# Patient Record
Sex: Female | Born: 1945 | Race: Black or African American | Hispanic: No | Marital: Married | Smoking: Former smoker
Health system: Southern US, Community
[De-identification: ages and names within clinical notes are randomized; demographics above are authoritative.]

## PROBLEM LIST (undated history)

## (undated) DIAGNOSIS — E039 Hypothyroidism, unspecified: Secondary | ICD-10-CM

## (undated) DIAGNOSIS — I82402 Acute embolism and thrombosis of unspecified deep veins of left lower extremity: Secondary | ICD-10-CM

## (undated) DIAGNOSIS — I10 Essential (primary) hypertension: Secondary | ICD-10-CM

## (undated) DIAGNOSIS — R591 Generalized enlarged lymph nodes: Principal | ICD-10-CM

## (undated) HISTORY — DX: Generalized enlarged lymph nodes: R59.1

## (undated) HISTORY — DX: Acute embolism and thrombosis of unspecified deep veins of left lower extremity: I82.402

---

## 1998-06-28 ENCOUNTER — Ambulatory Visit (HOSPITAL_COMMUNITY): Admission: RE | Admit: 1998-06-28 | Discharge: 1998-06-28 | Payer: Self-pay | Admitting: Internal Medicine

## 1998-06-28 ENCOUNTER — Encounter: Payer: Self-pay | Admitting: Internal Medicine

## 1998-07-10 ENCOUNTER — Ambulatory Visit (HOSPITAL_COMMUNITY): Admission: RE | Admit: 1998-07-10 | Discharge: 1998-07-10 | Payer: Self-pay | Admitting: Internal Medicine

## 1999-08-06 ENCOUNTER — Ambulatory Visit (HOSPITAL_COMMUNITY): Admission: RE | Admit: 1999-08-06 | Discharge: 1999-08-06 | Payer: Self-pay | Admitting: Internal Medicine

## 1999-08-06 ENCOUNTER — Encounter: Payer: Self-pay | Admitting: Internal Medicine

## 2000-12-01 ENCOUNTER — Ambulatory Visit (HOSPITAL_COMMUNITY): Admission: RE | Admit: 2000-12-01 | Discharge: 2000-12-01 | Payer: Self-pay | Admitting: Internal Medicine

## 2000-12-01 ENCOUNTER — Encounter: Payer: Self-pay | Admitting: Internal Medicine

## 2001-08-06 ENCOUNTER — Other Ambulatory Visit: Admission: RE | Admit: 2001-08-06 | Discharge: 2001-08-06 | Payer: Self-pay | Admitting: Internal Medicine

## 2002-01-08 ENCOUNTER — Encounter: Payer: Self-pay | Admitting: Internal Medicine

## 2002-01-08 ENCOUNTER — Ambulatory Visit (HOSPITAL_COMMUNITY): Admission: RE | Admit: 2002-01-08 | Discharge: 2002-01-08 | Payer: Self-pay | Admitting: Internal Medicine

## 2002-02-09 ENCOUNTER — Ambulatory Visit (HOSPITAL_COMMUNITY): Admission: RE | Admit: 2002-02-09 | Discharge: 2002-02-09 | Payer: Self-pay | Admitting: Gastroenterology

## 2002-02-09 ENCOUNTER — Encounter (INDEPENDENT_AMBULATORY_CARE_PROVIDER_SITE_OTHER): Payer: Self-pay | Admitting: Specialist

## 2002-08-10 ENCOUNTER — Other Ambulatory Visit: Admission: RE | Admit: 2002-08-10 | Discharge: 2002-08-10 | Payer: Self-pay | Admitting: Internal Medicine

## 2002-09-14 ENCOUNTER — Encounter: Payer: Self-pay | Admitting: Internal Medicine

## 2002-10-11 ENCOUNTER — Encounter: Payer: Self-pay | Admitting: Internal Medicine

## 2002-10-11 ENCOUNTER — Encounter (HOSPITAL_COMMUNITY): Admission: RE | Admit: 2002-10-11 | Discharge: 2003-01-09 | Payer: Self-pay | Admitting: Internal Medicine

## 2003-01-24 ENCOUNTER — Encounter: Payer: Self-pay | Admitting: Internal Medicine

## 2003-01-24 ENCOUNTER — Ambulatory Visit (HOSPITAL_COMMUNITY): Admission: RE | Admit: 2003-01-24 | Discharge: 2003-01-24 | Payer: Self-pay | Admitting: Internal Medicine

## 2004-07-31 ENCOUNTER — Ambulatory Visit (HOSPITAL_COMMUNITY): Admission: RE | Admit: 2004-07-31 | Discharge: 2004-07-31 | Payer: Self-pay | Admitting: Internal Medicine

## 2004-08-28 ENCOUNTER — Other Ambulatory Visit: Admission: RE | Admit: 2004-08-28 | Discharge: 2004-08-28 | Payer: Self-pay | Admitting: Internal Medicine

## 2005-09-25 ENCOUNTER — Other Ambulatory Visit: Admission: RE | Admit: 2005-09-25 | Discharge: 2005-09-25 | Payer: Self-pay | Admitting: Internal Medicine

## 2005-10-03 ENCOUNTER — Ambulatory Visit (HOSPITAL_COMMUNITY): Admission: RE | Admit: 2005-10-03 | Discharge: 2005-10-03 | Payer: Self-pay | Admitting: Internal Medicine

## 2005-10-04 ENCOUNTER — Ambulatory Visit (HOSPITAL_COMMUNITY): Admission: RE | Admit: 2005-10-04 | Discharge: 2005-10-04 | Payer: Self-pay | Admitting: Internal Medicine

## 2006-02-26 ENCOUNTER — Encounter: Payer: Self-pay | Admitting: Internal Medicine

## 2006-12-16 ENCOUNTER — Ambulatory Visit (HOSPITAL_COMMUNITY): Admission: RE | Admit: 2006-12-16 | Discharge: 2006-12-16 | Payer: Self-pay | Admitting: Internal Medicine

## 2006-12-24 ENCOUNTER — Other Ambulatory Visit: Admission: RE | Admit: 2006-12-24 | Discharge: 2006-12-24 | Payer: Self-pay | Admitting: Internal Medicine

## 2007-12-02 ENCOUNTER — Other Ambulatory Visit: Admission: RE | Admit: 2007-12-02 | Discharge: 2007-12-02 | Payer: Self-pay | Admitting: Internal Medicine

## 2007-12-18 ENCOUNTER — Ambulatory Visit (HOSPITAL_COMMUNITY): Admission: RE | Admit: 2007-12-18 | Discharge: 2007-12-18 | Payer: Self-pay | Admitting: Internal Medicine

## 2007-12-22 ENCOUNTER — Ambulatory Visit (HOSPITAL_COMMUNITY): Admission: RE | Admit: 2007-12-22 | Discharge: 2007-12-22 | Payer: Self-pay | Admitting: Internal Medicine

## 2008-12-06 ENCOUNTER — Other Ambulatory Visit: Admission: RE | Admit: 2008-12-06 | Discharge: 2008-12-06 | Payer: Self-pay | Admitting: Internal Medicine

## 2008-12-19 ENCOUNTER — Ambulatory Visit (HOSPITAL_COMMUNITY): Admission: RE | Admit: 2008-12-19 | Discharge: 2008-12-19 | Payer: Self-pay | Admitting: Internal Medicine

## 2010-05-03 ENCOUNTER — Encounter: Payer: Self-pay | Admitting: Internal Medicine

## 2010-05-03 ENCOUNTER — Ambulatory Visit: Payer: Self-pay | Admitting: Vascular Surgery

## 2010-05-03 ENCOUNTER — Ambulatory Visit (HOSPITAL_COMMUNITY): Admission: RE | Admit: 2010-05-03 | Discharge: 2010-05-03 | Payer: Self-pay | Admitting: Internal Medicine

## 2010-06-04 ENCOUNTER — Ambulatory Visit (HOSPITAL_COMMUNITY): Admission: RE | Admit: 2010-06-04 | Discharge: 2010-06-04 | Payer: Self-pay | Admitting: Internal Medicine

## 2011-02-25 ENCOUNTER — Other Ambulatory Visit: Payer: Self-pay | Admitting: Internal Medicine

## 2011-02-25 DIAGNOSIS — Z1231 Encounter for screening mammogram for malignant neoplasm of breast: Secondary | ICD-10-CM

## 2011-03-04 ENCOUNTER — Ambulatory Visit (HOSPITAL_COMMUNITY)
Admission: RE | Admit: 2011-03-04 | Discharge: 2011-03-04 | Disposition: A | Discharge status: Home / Self Care | Payer: BC Managed Care – PPO | Source: Ambulatory Visit | Attending: Internal Medicine | Admitting: Internal Medicine

## 2011-03-04 DIAGNOSIS — Z1231 Encounter for screening mammogram for malignant neoplasm of breast: Secondary | ICD-10-CM | POA: Insufficient documentation

## 2011-03-15 NOTE — Procedures (Signed)
[facility]  Patient:    Penny Stewart, Penny Stewart Visit Number: [id number] MRN: [id number]          Service Type: END Location: ENDO Attending Physician:  Nelda Marseille Dictated by:   Petra Kuba, M.D. Proc. Date: 02/09/02 Admit Date:  02/09/2002   CC:         Lindell Spar. Chestine Spore, M.D.                           Procedure Report  PROCEDURE:  Colonoscopy with biopsy.  INDICATIONS FOR PROCEDURE:  Family history of colon cancer due for colonic screening.  Consent was signed after risks, benefits, methods, and options were thoroughly discussed in the office.  MEDICINES USED:  Demerol 90, Versed 9.  DESCRIPTION OF PROCEDURE:  Rectal inspection was pertinent for external hemorrhoids, small. Digital exam was negative. The pediatric video adjustable colonoscope was inserted and easily advanced to the level of the ileocecal valve. On insertion in the left and right, diverticula were seen. On the left side of the colon, a small erosion was seen without particular worrisome appearance. Photo documentation was obtained. Unfortunately, this erosion was not able to be found on the way back to evaluate it and then biopsy it. To advance to the cecum required rolling her on her back and some abdominal pressure. The cecum was identified by the appendiceal orifice and the ileocecal valve. The prep was adequate. The scope was slowly withdrawn. Other than the right and left sided diverticula, on slow withdrawal through the right side of the colon, no abnormalities were seen. The transverse was normal until the proximal level of the splenic flexure where a small probable lipoma was seen down to polyp and a few cold biopsies were obtained. The scope was further withdrawn around the left side of the colon. Mid sigmoid the distal sigmoid hyperplastic appearing poly was seen and was cold biopsied x1 and put in the same container with the other questionable polyp. The scope  was withdrawn back to the rectum. We did not see the erosion as mentioned above. Once back in the rectum, the scope was retroflexed pertinent for some internal hemorrhoids. The scope was straightened and readvanced around the left side of the colon. On insertion, we could not find the erosion and then on slow withdrawal one more time again we could not find it. We elected to stop the procedure at this juncture, air was suctioned, scope removed. The patient tolerated the procedure adequately. There was no obvious or immediate complications.  ENDOSCOPIC DIAGNOSIS:  1. Internal and external small hemorrhoids.  2. Left and right diverticula.  3. Two questionable polyps one in the splenic flexure questionable lipoma,     one tiny in the sigmoid probably hyperplastic cold biopsied.  4. Small erosions seen on insertion on the left side unable to find     on withdrawal probably prep or medicine induced like aspirin or     nonsteroidal.  PLAN:  Await path but probably recheck colon screening in five years. Happy to see back p.r.n. otherwise return care to Dr. Chestine Spore for the customary health care maintenance to include yearly rectals and guaiacs. Dictated by:   Petra Kuba, M.D. Attending Physician:  Nelda Marseille DD:  02/09/02 TD:  02/09/02 Job: [id number]

## 2012-03-20 ENCOUNTER — Other Ambulatory Visit: Payer: Self-pay | Admitting: Internal Medicine

## 2012-03-20 DIAGNOSIS — Z1231 Encounter for screening mammogram for malignant neoplasm of breast: Secondary | ICD-10-CM

## 2012-04-13 ENCOUNTER — Ambulatory Visit (HOSPITAL_COMMUNITY)
Admission: RE | Admit: 2012-04-13 | Discharge: 2012-04-13 | Disposition: A | Discharge status: Home / Self Care | Payer: Medicare Other | Source: Ambulatory Visit | Attending: Internal Medicine | Admitting: Internal Medicine

## 2012-04-13 DIAGNOSIS — Z1231 Encounter for screening mammogram for malignant neoplasm of breast: Secondary | ICD-10-CM | POA: Insufficient documentation

## 2012-06-09 ENCOUNTER — Other Ambulatory Visit (HOSPITAL_COMMUNITY)
Admission: RE | Admit: 2012-06-09 | Discharge: 2012-06-09 | Disposition: A | Discharge status: Home / Self Care | Payer: Medicare Other | Source: Ambulatory Visit | Attending: Internal Medicine | Admitting: Internal Medicine

## 2012-06-09 DIAGNOSIS — Z124 Encounter for screening for malignant neoplasm of cervix: Secondary | ICD-10-CM | POA: Insufficient documentation

## 2012-08-03 ENCOUNTER — Other Ambulatory Visit: Payer: Self-pay | Admitting: Gastroenterology

## 2013-04-20 ENCOUNTER — Other Ambulatory Visit: Payer: Self-pay | Admitting: Internal Medicine

## 2013-04-20 DIAGNOSIS — Z1231 Encounter for screening mammogram for malignant neoplasm of breast: Secondary | ICD-10-CM

## 2013-05-03 ENCOUNTER — Ambulatory Visit (HOSPITAL_COMMUNITY)
Admission: RE | Admit: 2013-05-03 | Discharge: 2013-05-03 | Disposition: A | Discharge status: Home / Self Care | Payer: Medicare Other | Source: Ambulatory Visit | Attending: Internal Medicine | Admitting: Internal Medicine

## 2013-05-03 DIAGNOSIS — Z1231 Encounter for screening mammogram for malignant neoplasm of breast: Secondary | ICD-10-CM | POA: Insufficient documentation

## 2014-05-04 ENCOUNTER — Other Ambulatory Visit: Payer: Self-pay | Admitting: Internal Medicine

## 2014-05-04 DIAGNOSIS — Z1231 Encounter for screening mammogram for malignant neoplasm of breast: Secondary | ICD-10-CM

## 2014-05-10 ENCOUNTER — Ambulatory Visit (HOSPITAL_COMMUNITY)
Admission: RE | Admit: 2014-05-10 | Discharge: 2014-05-10 | Disposition: A | Discharge status: Home / Self Care | Payer: Medicare Other | Source: Ambulatory Visit | Attending: Internal Medicine | Admitting: Internal Medicine

## 2014-05-10 DIAGNOSIS — Z1231 Encounter for screening mammogram for malignant neoplasm of breast: Secondary | ICD-10-CM | POA: Insufficient documentation

## 2014-11-16 DIAGNOSIS — R51 Headache: Secondary | ICD-10-CM | POA: Diagnosis not present

## 2014-11-16 DIAGNOSIS — R634 Abnormal weight loss: Secondary | ICD-10-CM | POA: Diagnosis not present

## 2014-11-16 DIAGNOSIS — E780 Pure hypercholesterolemia: Secondary | ICD-10-CM | POA: Diagnosis not present

## 2014-11-16 DIAGNOSIS — I10 Essential (primary) hypertension: Secondary | ICD-10-CM | POA: Diagnosis not present

## 2014-11-16 DIAGNOSIS — Z23 Encounter for immunization: Secondary | ICD-10-CM | POA: Diagnosis not present

## 2014-11-16 DIAGNOSIS — E039 Hypothyroidism, unspecified: Secondary | ICD-10-CM | POA: Diagnosis not present

## 2015-06-26 ENCOUNTER — Other Ambulatory Visit: Payer: Self-pay | Admitting: Internal Medicine

## 2015-06-26 DIAGNOSIS — Z1231 Encounter for screening mammogram for malignant neoplasm of breast: Secondary | ICD-10-CM

## 2015-07-04 ENCOUNTER — Ambulatory Visit (HOSPITAL_COMMUNITY)
Admission: RE | Admit: 2015-07-04 | Discharge: 2015-07-04 | Disposition: A | Discharge status: Home / Self Care | Payer: Medicare Other | Source: Ambulatory Visit | Attending: Internal Medicine | Admitting: Internal Medicine

## 2015-07-04 DIAGNOSIS — Z1231 Encounter for screening mammogram for malignant neoplasm of breast: Secondary | ICD-10-CM | POA: Diagnosis not present

## 2015-07-11 DIAGNOSIS — E559 Vitamin D deficiency, unspecified: Secondary | ICD-10-CM | POA: Diagnosis not present

## 2015-07-11 DIAGNOSIS — E039 Hypothyroidism, unspecified: Secondary | ICD-10-CM | POA: Diagnosis not present

## 2015-07-11 DIAGNOSIS — E780 Pure hypercholesterolemia: Secondary | ICD-10-CM | POA: Diagnosis not present

## 2015-07-11 DIAGNOSIS — I10 Essential (primary) hypertension: Secondary | ICD-10-CM | POA: Diagnosis not present

## 2015-12-27 DIAGNOSIS — I10 Essential (primary) hypertension: Secondary | ICD-10-CM | POA: Diagnosis not present

## 2015-12-27 DIAGNOSIS — E7800 Pure hypercholesterolemia, unspecified: Secondary | ICD-10-CM | POA: Diagnosis not present

## 2015-12-27 DIAGNOSIS — E039 Hypothyroidism, unspecified: Secondary | ICD-10-CM | POA: Diagnosis not present

## 2015-12-27 DIAGNOSIS — E559 Vitamin D deficiency, unspecified: Secondary | ICD-10-CM | POA: Diagnosis not present

## 2016-04-11 DIAGNOSIS — E7800 Pure hypercholesterolemia, unspecified: Secondary | ICD-10-CM | POA: Diagnosis not present

## 2016-04-11 DIAGNOSIS — I10 Essential (primary) hypertension: Secondary | ICD-10-CM | POA: Diagnosis not present

## 2016-04-11 DIAGNOSIS — E039 Hypothyroidism, unspecified: Secondary | ICD-10-CM | POA: Diagnosis not present

## 2016-05-29 DIAGNOSIS — I10 Essential (primary) hypertension: Secondary | ICD-10-CM | POA: Diagnosis not present

## 2016-05-29 DIAGNOSIS — E7800 Pure hypercholesterolemia, unspecified: Secondary | ICD-10-CM | POA: Diagnosis not present

## 2016-05-29 DIAGNOSIS — E039 Hypothyroidism, unspecified: Secondary | ICD-10-CM | POA: Diagnosis not present

## 2016-07-15 ENCOUNTER — Other Ambulatory Visit: Payer: Self-pay | Admitting: Internal Medicine

## 2016-07-15 DIAGNOSIS — Z1231 Encounter for screening mammogram for malignant neoplasm of breast: Secondary | ICD-10-CM

## 2016-07-23 ENCOUNTER — Ambulatory Visit
Admission: RE | Admit: 2016-07-23 | Discharge: 2016-07-23 | Disposition: A | Discharge status: Home / Self Care | Payer: Medicare Other | Source: Ambulatory Visit | Attending: Internal Medicine | Admitting: Internal Medicine

## 2016-07-23 DIAGNOSIS — Z1231 Encounter for screening mammogram for malignant neoplasm of breast: Secondary | ICD-10-CM | POA: Diagnosis not present

## 2016-10-01 DIAGNOSIS — E039 Hypothyroidism, unspecified: Secondary | ICD-10-CM | POA: Diagnosis not present

## 2016-10-01 DIAGNOSIS — E781 Pure hyperglyceridemia: Secondary | ICD-10-CM | POA: Diagnosis not present

## 2016-10-01 DIAGNOSIS — I10 Essential (primary) hypertension: Secondary | ICD-10-CM | POA: Diagnosis not present

## 2017-02-04 ENCOUNTER — Other Ambulatory Visit: Payer: Self-pay | Admitting: Internal Medicine

## 2017-02-04 DIAGNOSIS — R630 Anorexia: Secondary | ICD-10-CM

## 2017-02-04 DIAGNOSIS — R634 Abnormal weight loss: Secondary | ICD-10-CM

## 2017-02-04 DIAGNOSIS — R1909 Other intra-abdominal and pelvic swelling, mass and lump: Secondary | ICD-10-CM

## 2017-02-07 ENCOUNTER — Ambulatory Visit (HOSPITAL_COMMUNITY)
Admission: RE | Admit: 2017-02-07 | Discharge: 2017-02-07 | Disposition: A | Discharge status: Home / Self Care | Payer: Medicare Other | Source: Ambulatory Visit | Attending: Internal Medicine | Admitting: Internal Medicine

## 2017-02-07 DIAGNOSIS — I700 Atherosclerosis of aorta: Secondary | ICD-10-CM | POA: Insufficient documentation

## 2017-02-07 DIAGNOSIS — D7389 Other diseases of spleen: Secondary | ICD-10-CM | POA: Insufficient documentation

## 2017-02-07 DIAGNOSIS — R591 Generalized enlarged lymph nodes: Secondary | ICD-10-CM | POA: Insufficient documentation

## 2017-02-07 DIAGNOSIS — R1909 Other intra-abdominal and pelvic swelling, mass and lump: Secondary | ICD-10-CM

## 2017-02-07 DIAGNOSIS — R634 Abnormal weight loss: Secondary | ICD-10-CM | POA: Diagnosis present

## 2017-02-07 DIAGNOSIS — R630 Anorexia: Secondary | ICD-10-CM | POA: Diagnosis present

## 2017-02-07 DIAGNOSIS — R599 Enlarged lymph nodes, unspecified: Secondary | ICD-10-CM

## 2017-02-07 HISTORY — DX: Enlarged lymph nodes, unspecified: R59.9

## 2017-02-07 MED ORDER — IOPAMIDOL (ISOVUE-300) INJECTION 61%
INTRAVENOUS | Status: AC
  Filled 2017-02-07: qty 100

## 2017-02-07 MED ORDER — IOPAMIDOL (ISOVUE-300) INJECTION 61%
100.0000 mL | Freq: Once | INTRAVENOUS | Status: AC | PRN
  Administered 2017-02-07: 80 mL via INTRAVENOUS

## 2017-02-11 ENCOUNTER — Telehealth: Payer: Self-pay | Admitting: Oncology

## 2017-02-11 ENCOUNTER — Telehealth: Payer: Self-pay | Admitting: Nurse Practitioner

## 2017-02-11 NOTE — Telephone Encounter (Signed)
Error

## 2017-02-11 NOTE — Telephone Encounter (Signed)
Appt has been scheduled for the pt to see Lisa/Dr. Burr Medico on 4/19 at 230pm. Pt agreed to the appt date and time. Aware to arrive 30 minutes early to get checked in on time.

## 2017-02-13 ENCOUNTER — Ambulatory Visit (HOSPITAL_BASED_OUTPATIENT_CLINIC_OR_DEPARTMENT_OTHER): Payer: Medicare Other | Admitting: Nurse Practitioner

## 2017-02-13 ENCOUNTER — Ambulatory Visit (HOSPITAL_BASED_OUTPATIENT_CLINIC_OR_DEPARTMENT_OTHER): Payer: Medicare Other

## 2017-02-13 ENCOUNTER — Encounter: Payer: Self-pay | Admitting: Nurse Practitioner

## 2017-02-13 ENCOUNTER — Telehealth: Payer: Self-pay | Admitting: Nurse Practitioner

## 2017-02-13 ENCOUNTER — Ambulatory Visit: Payer: Medicare Other | Admitting: Nurse Practitioner

## 2017-02-13 DIAGNOSIS — R599 Enlarged lymph nodes, unspecified: Secondary | ICD-10-CM

## 2017-02-13 DIAGNOSIS — R630 Anorexia: Secondary | ICD-10-CM | POA: Diagnosis not present

## 2017-02-13 DIAGNOSIS — Z87891 Personal history of nicotine dependence: Secondary | ICD-10-CM

## 2017-02-13 DIAGNOSIS — R591 Generalized enlarged lymph nodes: Secondary | ICD-10-CM

## 2017-02-13 DIAGNOSIS — I10 Essential (primary) hypertension: Secondary | ICD-10-CM | POA: Diagnosis not present

## 2017-02-13 DIAGNOSIS — E039 Hypothyroidism, unspecified: Secondary | ICD-10-CM

## 2017-02-13 DIAGNOSIS — Z800 Family history of malignant neoplasm of digestive organs: Secondary | ICD-10-CM

## 2017-02-13 DIAGNOSIS — R634 Abnormal weight loss: Secondary | ICD-10-CM

## 2017-02-13 DIAGNOSIS — Z809 Family history of malignant neoplasm, unspecified: Secondary | ICD-10-CM

## 2017-02-13 LAB — CBC WITH DIFFERENTIAL/PLATELET
BASO%: 0.3 % (ref 0.0–2.0)
Basophils Absolute: 0 10*3/uL (ref 0.0–0.1)
EOS%: 1.3 % (ref 0.0–7.0)
Eosinophils Absolute: 0.2 10*3/uL (ref 0.0–0.5)
HCT: 38 % (ref 34.8–46.6)
HEMOGLOBIN: 12.3 g/dL (ref 11.6–15.9)
LYMPH%: 12.6 % — ABNORMAL LOW (ref 14.0–49.7)
MCH: 26.9 pg (ref 25.1–34.0)
MCHC: 32.4 g/dL (ref 31.5–36.0)
MCV: 83 fL (ref 79.5–101.0)
MONO#: 0.8 10*3/uL (ref 0.1–0.9)
MONO%: 6.3 % (ref 0.0–14.0)
NEUT%: 79.5 % — ABNORMAL HIGH (ref 38.4–76.8)
NEUTROS ABS: 10.6 10*3/uL — AB (ref 1.5–6.5)
Platelets: 217 10*3/uL (ref 145–400)
RBC: 4.58 10*6/uL (ref 3.70–5.45)
RDW: 12.8 % (ref 11.2–14.5)
WBC: 13.4 10*3/uL — AB (ref 3.9–10.3)
lymph#: 1.7 10*3/uL (ref 0.9–3.3)

## 2017-02-13 LAB — COMPREHENSIVE METABOLIC PANEL
ALBUMIN: 4 g/dL (ref 3.5–5.0)
ALK PHOS: 136 U/L (ref 40–150)
ALT: 10 U/L (ref 0–55)
AST: 20 U/L (ref 5–34)
Anion Gap: 12 mEq/L — ABNORMAL HIGH (ref 3–11)
BUN: 15 mg/dL (ref 7.0–26.0)
CO2: 28 mEq/L (ref 22–29)
Calcium: 9.8 mg/dL (ref 8.4–10.4)
Chloride: 102 mEq/L (ref 98–109)
Creatinine: 0.8 mg/dL (ref 0.6–1.1)
EGFR: >90 mL/min/{1.73_m2} (ref >90)
GLUCOSE: 109 mg/dL (ref 70–140)
POTASSIUM: 4.2 meq/L (ref 3.5–5.1)
SODIUM: 142 meq/L (ref 136–145)
Total Bilirubin: 0.34 mg/dL (ref 0.20–1.20)
Total Protein: 7.6 g/dL (ref 6.4–8.3)

## 2017-02-13 LAB — LACTATE DEHYDROGENASE: LDH: 252 U/L — ABNORMAL HIGH (ref 125–245)

## 2017-02-13 NOTE — Addendum Note (Signed)
Addended by: Truitt Merle on: 02/13/2017 11:34 PM   Modules accepted: Orders

## 2017-02-13 NOTE — Telephone Encounter (Signed)
Gave patient AVS and calender per 4/19 los.

## 2017-02-13 NOTE — Progress Notes (Addendum)
[place]  Telephone:([phone]) [phone] Fax:([phone]) [place] Note   Patient Care Team: Foye Spurling, MD as PCP - General (Internal Medicine) 02/13/2017  CHIEF COMPLAINTS/PURPOSE OF CONSULTATION:  Lymphadenopathy  HISTORY OF PRESENTING ILLNESS:  Ms. Kozuch is a 71 year old woman referred for evaluation of lymphadenopathy. She reports weight loss over the past one to 2 years. Her husband reports more weight loss recently. For the past 2 months she has noted increasing fatigue and  in general not feeling well. 3-4 weeks ago she noted a "hard spot" in the left groin. She saw Dr. Carlis Abbott and was referred for CT scans of the abdomen and pelvis. She was found to have extensive retroperitoneal, left inguinal, right femoral and mesenteric lymphadenopathy. Multiple splenic masses were noted. She was referred to the [facility] for further evaluation.  MEDICAL HISTORY:  1. Hypothyroidism 2. Hypertension 3. Hypercholesterolemia  SURGICAL HISTORY: None  SOCIAL HISTORY: She lives in [place]. She is married. She has one son in good health. She is retired from [employer]. She quit smoking 2 years ago, less than 1 pack per day for greater than 25 years. Every "2 or so days" she either has a glass of wine or a shot of cognac.  FAMILY HISTORY: Mother deceased age 43 with colon cancer. Father deceased age 65 with heart problems. 10 siblings, all deceased. One sister died with "bile duct cancer". She thinks another sister may have had cancer as well.  ALLERGIES:  No known drug allergies  MEDICATIONS:  Current Outpatient Prescriptions  Medication Sig Dispense Refill  . amLODipine (NORVASC) 10 MG tablet Take 10 mg by mouth daily.    [name] atorvastatin (LIPITOR) 20 MG tablet Take 20 mg by mouth daily.    . cyanocobalamin 500 MCG tablet Take 500 mcg by mouth daily.    . mirtazapine (REMERON) 15 MG tablet Take 15 mg by mouth at bedtime.     No current  facility-administered medications for this visit.     REVIEW OF SYSTEMS:   Constitutional: Denies fevers, chills or abnormal night sweats. Decreased appetite. Poor energy level. Eyes: No vision change. No diplopia. Ears, nose, mouth, throat, and face: Denies mucositis or sore throat Respiratory: Denies cough, dyspnea or wheezes Cardiovascular: Denies palpitation, chest discomfort or lower extremity swelling Gastrointestinal:  Occasional mild nausea with certain scents. No dysphagia. Intermittent mild constipation. No bloody or black stools. Skin: Denies abnormal skin rashes Lymphatics: "Hard spot" left groin. Neurological: Denies numbness, tingling in the extremities. GU: No hematuria or dysuria. Behavioral/Psych: Husband has noted recent memory issues.  All other systems were reviewed with the patient and are negative.  PHYSICAL EXAMINATION:  Vitals:   02/13/17 1427  BP: 134/72  Pulse: 96  Resp: 16  Temp: 97.7 F (36.5 C)   Filed Weights   02/13/17 1427  Weight: 102 lb 14.4 oz (46.7 kg)    GENERAL:alert, no distress and comfortable SKIN: Pale appearing. No rashes or significant lesions EYES: normal, conjunctiva are pink and non-injected, sclera clear OROPHARYNX:no exudate, no erythema and lips, buccal mucosa, and tongue normal  NECK: supple, thyroid normal size, non-tender, without nodularity LYMPH:  1/2-2 cm right axillary lymph node; 1.5 cm left axillary lymph node; 3 cm left inguinal lymph node; 2 cm right femoral lymph node LUNGS: clear to auscultation and percussion with normal breathing effort HEART: regular rate & rhythm and no murmurs and no lower extremity edema ABDOMEN:abdomen soft, non-tender and normal bowel sounds; no  organomegaly. Musculoskeletal:no cyanosis of digits and no clubbing  PSYCH: alert & oriented x 3 with fluent speech NEURO: no focal motor/sensory deficits BREAST: No mass palpated in either breast RECTAL: No rectal mass. Stool brown, Hemoccult  negative  LABORATORY DATA:  None   RADIOGRAPHIC STUDIES: I have personally reviewed the radiological images as listed and agreed with the findings in the report. Ct Abdomen Pelvis W Contrast  Result Date: 02/07/2017 CLINICAL DATA:  Left inguinal mass.  Weight loss. EXAM: CT ABDOMEN AND PELVIS WITH CONTRAST TECHNIQUE: Multidetector CT imaging of the abdomen and pelvis was performed using the standard protocol following bolus administration of intravenous contrast. CONTRAST:  60m ISOVUE-300 IOPAMIDOL (ISOVUE-300) INJECTION 61% COMPARISON:  None. FINDINGS: Lower chest: Small fat containing Bochdalek's hernia and mild scarring in the left lung base. No pleural effusion. Hepatobiliary: 8 mm hypodensity in the left hepatic lobe, too small to fully characterize. Unremarkable gallbladder. No biliary dilatation. Pancreas: Unremarkable. Spleen: Multiple hypoenhancing splenic masses measuring up to 3.1 cm in size. Adrenals/Urinary Tract: Unremarkable right adrenal gland. Slight left adrenal nodularity. Two subcentimeter low-density lesions in the left kidney, too small to fully characterize. 5.1 x 4.7 cm cyst in the right lower pole. No hydronephrosis. Decompressed bladder. Stomach/Bowel: The stomach is within normal limits. There is no evidence of bowel obstruction. Colonic diverticulosis is noted without evidence of diverticulitis. The appendix is unremarkable. Vascular/Lymphatic: Diffuse abdominal aortic atherosclerosis without aneurysm. There is diffuse retroperitoneal lymphadenopathy. Left para-aortic lymph nodes measure up to 1.7 cm in short axis. Aortocaval lymph nodes measure up to 1.5 cm. An enlarged left pelvic sidewall lymph node measures 1.9 cm. Left external iliac lymph nodes measure up to 1.6 cm. Left inguinal lymph nodes measure up to 2.1 cm. There is a 1.5 cm right femoral lymph node. 2 adjacent lymph nodes/soft tissue nodules in the pelvic mesentery adjacent to the sigmoid colon measure 1.2 x 1.1 cm  and 1.6 x 1.2 cm (series 2, images 52 and 55). The mesenteric and some of the retroperitoneal lymph nodes demonstrate heterogeneous hypoenhancement and may be partially necrotic. Some of the inguinal/ femoral lymph nodes appear more hyperenhancing. Reproductive: The uterus and ovaries are unremarkable. Prominent vascular structures are noted in the adnexal bilaterally, particularly on the left with a prominent left gonadal vein noted. Other: No intraperitoneal free fluid.  No abdominal wall hernia. Musculoskeletal: No suspicious osseous lesions identified. IMPRESSION: 1. Extensive retroperitoneal, left inguinal, right femoral, and mesenteric lymphadenopathy highly concerning for malignancy and which may reflect metastatic disease or lymphoma. No clear primary malignancy identified in the abdomen or pelvis. 2. Multiple splenic masses consistent with metastases. 3. Aortic atherosclerosis. These results will be called to the ordering clinician or representative by the Radiologist Assistant, and communication documented in the PACS or zVision Dashboard. Electronically Signed   By: ALogan BoresM.D.   On: 02/07/2017 16:22    ASSESSMENT & PLAN:  1. Adenopathy on examination and CT 2. Anorexia/weight loss 3. Fatigue/malaise 4. Hypothyroid-reports she takes Synthroid but this is not on her medication list 5. Hypertension-on Norvasc 6. Hypercholesterolemia-on Lipitor  Ms. SAgostinellipresents with a several month history of weight loss and fatigue/malaise. She recently presented to her primary physician, Dr. CCarlis Abbott with palpable left inguinal adenopathy. CT scans of the abdomen/pelvis show extensive retroperitoneal, left inguinal, right femoral and mesenteric lymphadenopathy.  Dr. FBurr Medicoreviewed the above findings with Ms. SKlughand her family. They understand the lymphadenopathy may represent lymphoma versus another malignancy. We are referring her for an excisional  lymph node biopsy. We will obtain baseline labs  today to include a CBC, chemistry panel and LDH. We are referring her for a PET scan.  We scheduled a return visit in 2 weeks to review the results of the biopsy. She will contact the office in the interim with any problems.     Orders Placed This Encounter  Procedures  . NM PET Image Initial (PI) Skull Base To Thigh    Standing Status:   Future    Standing Expiration Date:   02/13/2018    Order Specific Question:   Reason for Exam (SYMPTOM  OR DIAGNOSIS REQUIRED)    Answer:   abdominal and pelvic adenopathy on CT    Order Specific Question:   If indicated for the ordered procedure, I authorize the administration of a radiopharmaceutical per Radiology protocol    Answer:   Yes    Order Specific Question:   Preferred imaging location?    Answer:   [facility]    Order Specific Question:   Radiology Contrast Protocol - do NOT remove file path    Answer:   \\[email or web address]  . CBC with Differential    Standing Status:   Future    Standing Expiration Date:   02/13/2018  . Comprehensive metabolic panel    Standing Status:   Future    Standing Expiration Date:   02/13/2018  . Lactate dehydrogenase (LDH)    Standing Status:   Future    Standing Expiration Date:   02/13/2018     Patient seen with Dr. Burr Medico.      Ned Card, NP 02/13/2017 3:48 PM  Addendum I have seen the patient, examined her. I agree with the assessment and and plan and have edited the notes.   71 year old female with past medical history of hypothyroidism, hypertension, presented with 2 month history of fatigue, anorexia, weight loss and diffuse adenopathy. This is highly suspicious for malignancy, especially high-grade lymphoma. Metastatic cancer is also a possibility, although no primary site was identified on the CT abdomen and pelvis, or on physical exam (head and neck, breast, anal/rectal primary etc). We'll obtain lab CBC, CMP, LDH today, and order a PET scan for further  evaluation. I'll refer her to general surgeon for excisional lymph node biopsy, hopefully to be done within the next week. I'll see her back after the scan and biopsy.  Truitt Merle  02/13/2017

## 2017-02-14 ENCOUNTER — Other Ambulatory Visit: Payer: Self-pay | Admitting: General Surgery

## 2017-02-18 ENCOUNTER — Encounter (HOSPITAL_COMMUNITY): Payer: Self-pay

## 2017-02-18 ENCOUNTER — Encounter (HOSPITAL_COMMUNITY)
Admission: RE | Admit: 2017-02-18 | Discharge: 2017-02-18 | Disposition: A | Discharge status: Home / Self Care | Payer: Medicare Other | Source: Ambulatory Visit | Attending: General Surgery | Admitting: General Surgery

## 2017-02-18 DIAGNOSIS — C773 Secondary and unspecified malignant neoplasm of axilla and upper limb lymph nodes: Secondary | ICD-10-CM | POA: Diagnosis not present

## 2017-02-18 DIAGNOSIS — R591 Generalized enlarged lymph nodes: Secondary | ICD-10-CM | POA: Diagnosis present

## 2017-02-18 DIAGNOSIS — Z87891 Personal history of nicotine dependence: Secondary | ICD-10-CM | POA: Diagnosis not present

## 2017-02-18 DIAGNOSIS — C801 Malignant (primary) neoplasm, unspecified: Secondary | ICD-10-CM | POA: Diagnosis not present

## 2017-02-18 DIAGNOSIS — Z79899 Other long term (current) drug therapy: Secondary | ICD-10-CM | POA: Diagnosis not present

## 2017-02-18 DIAGNOSIS — Z800 Family history of malignant neoplasm of digestive organs: Secondary | ICD-10-CM | POA: Diagnosis not present

## 2017-02-18 DIAGNOSIS — E039 Hypothyroidism, unspecified: Secondary | ICD-10-CM | POA: Diagnosis not present

## 2017-02-18 DIAGNOSIS — R630 Anorexia: Secondary | ICD-10-CM | POA: Diagnosis not present

## 2017-02-18 DIAGNOSIS — R634 Abnormal weight loss: Secondary | ICD-10-CM | POA: Diagnosis not present

## 2017-02-18 DIAGNOSIS — I10 Essential (primary) hypertension: Secondary | ICD-10-CM | POA: Diagnosis not present

## 2017-02-18 DIAGNOSIS — E785 Hyperlipidemia, unspecified: Secondary | ICD-10-CM | POA: Diagnosis not present

## 2017-02-18 HISTORY — DX: Hypothyroidism, unspecified: E03.9

## 2017-02-18 HISTORY — DX: Essential (primary) hypertension: I10

## 2017-02-18 LAB — CBC WITH DIFFERENTIAL/PLATELET
Basophils Absolute: 0.1 10*3/uL (ref 0.0–0.1)
Basophils Relative: 0 %
EOS PCT: 1 %
Eosinophils Absolute: 0.2 10*3/uL (ref 0.0–0.7)
HCT: 37.7 % (ref 36.0–46.0)
Hemoglobin: 12 g/dL (ref 12.0–15.0)
LYMPHS ABS: 1.4 10*3/uL (ref 0.7–4.0)
LYMPHS PCT: 11 %
MCH: 26.3 pg (ref 26.0–34.0)
MCHC: 31.8 g/dL (ref 30.0–36.0)
MCV: 82.5 fL (ref 78.0–100.0)
MONO ABS: 0.6 10*3/uL (ref 0.1–1.0)
MONOS PCT: 5 %
Neutro Abs: 10.6 10*3/uL — ABNORMAL HIGH (ref 1.7–7.7)
Neutrophils Relative %: 83 %
PLATELETS: 245 10*3/uL (ref 150–400)
RBC: 4.57 MIL/uL (ref 3.87–5.11)
RDW: 12.6 % (ref 11.5–15.5)
WBC: 12.9 10*3/uL — ABNORMAL HIGH (ref 4.0–10.5)

## 2017-02-18 LAB — COMPREHENSIVE METABOLIC PANEL
ALT: 11 U/L — ABNORMAL LOW (ref 14–54)
AST: 22 U/L (ref 15–41)
Albumin: 4.1 g/dL (ref 3.5–5.0)
Alkaline Phosphatase: 111 U/L (ref 38–126)
Anion gap: 10 (ref 5–15)
BUN: 18 mg/dL (ref 6–20)
CHLORIDE: 105 mmol/L (ref 101–111)
CO2: 25 mmol/L (ref 22–32)
Calcium: 9.5 mg/dL (ref 8.9–10.3)
Creatinine, Ser: 0.63 mg/dL (ref 0.44–1.00)
Glucose, Bld: 87 mg/dL (ref 65–99)
POTASSIUM: 3.9 mmol/L (ref 3.5–5.1)
Sodium: 140 mmol/L (ref 135–145)
TOTAL PROTEIN: 7.2 g/dL (ref 6.5–8.1)
Total Bilirubin: 0.4 mg/dL (ref 0.3–1.2)

## 2017-02-18 NOTE — Pre-Procedure Instructions (Signed)
    Penny Stewart  02/18/2017      [facility] [place] [address] [place] [address] Phone: [phone] Fax: [phone]    Your procedure is scheduled on April 26.  Report to [facility] Admitting at 1:30 P   Call this number if you have problems the morning of surgery:  [phone]   Remember:  Do not eat food or drink liquids after midnight.  Take these medicines the morning of surgery with A SIP OF WATER :amLODipine (NORVASC), levothyroxine (SYNTHROID, LEVOTHROID)   STOP aspirin, herbal medication, vitamins, advil, aleve, ibuprofen   Do not wear jewelry, make-up or nail polish.  Do not wear lotions, powders, or perfumes, or deoderant.  Do not shave 48 hours prior to surgery.  Men may shave face and neck.  Do not bring valuables to the hospital.  [facility] is not responsible for any belongings or valuables.  Contacts, dentures or bridgework may not be worn into surgery.  Leave your suitcase in the car.  After surgery it may be brought to your room.  For patients admitted to the hospital, discharge time will be determined by your treatment team.  Patients discharged the day of surgery will not be allowed to drive home.   Name and phone number of your driver:    Special instructions:  Preparing for surgery  Please read over the following fact sheets that you were given. Pain Booklet and Surgical Site Infection Prevention

## 2017-02-19 NOTE — H&P (Signed)
Penny Stewart Location: [facility] Patient #: [id number] DOB: 1946-08-22 Undefined / Language: [name] / Race: White Female        History of Present Illness      The patient is a 71 year old female who presents with a complaint of diffuse lymphadenopathy. This is a 71 year old woman who is here with her husband to discuss right axillary lymph node biopsy. She is referred by Dr. Burr Medico. Her PCP is Jeanann Lewandowsky.      She has noticed a lump in her left groin for 3 or 4 weeks. It's a little tender but no pain at rest. Denies trauma or inflammation or drainage. She reports a 20 pound weight loss over 2 years. Increasing fatigue and anorexia. Denies skin rash night sweats. A CT scan shows extensive retroperitoneal, left inguinal, right femoral, and mesenteric lymphadenopathy, concerning for malignancy. 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 Lab work shows hemoglobin 12.3, WBC 13,400, platelet count 217,000. Creatinine 0.8. LDH 252. Liver tests normal.      Past history reveals hypothyroidism, hypertension, hyperlipidemia. She has never had surgery.      Family history mother deceased age 21 with colon cancer. Father deceased age 54 with heart problems. All 10 silks siblings have deceased. One had bile duct cancer. He may have been another cancer. Doesn't know too much about the history there.      Social history she lives in [place] with her husband. One son in good health. She is referred retired from the [employer] and worked at [employer]. Quit smoking 2 years ago. Occasional glass of wine.       She is scheduled for right axillary deep lymph node excisional biopsy this coming Thursday, 2 days from now. She going over to [facility] today for preadmission testing. I discussed the indications, details, techniques, and numerous risk of the surgery with her and her husband. She is aware the risks of bleeding,  infection, hematoma, nerve damage with chronic pain or numbness under the arm, arm swelling, shoulder disability, and other unforeseen problems. She understands all these issues. All of her questions are answered. She agrees with this plan.       I told her that if this turned out to be lymphoma that she might need chemotherapy. I told her that I would standby to insert a Port-A-Cath which she is familiar with, should that be necessary.   Past Surgical History  Colon Polyp Removal - Colonoscopy   Allergies  No Known Drug Allergies 02/18/2017  Medication History AmLODIPine Besylate ('10MG'$  Tablet, Oral) Active. Atorvastatin Calcium ('20MG'$  Tablet, Oral) Active. Mirtazapine ('15MG'$  Tablet, Oral) Active. Medications Reconciled  Social History  Alcohol use  Occasional alcohol use. Caffeine use  Carbonated beverages, Coffee. No drug use  Tobacco use  Former smoker.  Family History  Family history unknown  First Degree Relatives   Other Problems  No pertinent past medical history     Review of Systems  General Present- Appetite Loss, Chills and Fatigue. Not Present- Fever, Night Sweats, Weight Gain and Weight Loss. Skin Not Present- Change in Wart/Mole, Dryness, Hives, Jaundice, New Lesions, Non-Healing Wounds, Rash and Ulcer. HEENT Not Present- Earache, Hearing Loss, Hoarseness, Nose Bleed, Oral Ulcers, Ringing in the Ears, Seasonal Allergies, Sinus Pain, Sore Throat, Visual Disturbances, Wears glasses/contact lenses and Yellow Eyes. Respiratory Not Present- Bloody sputum, Chronic Cough, Difficulty Breathing, Snoring and Wheezing. Breast Not Present- Breast Mass, Breast Pain, Nipple Discharge and Skin Changes.  Cardiovascular Not Present- Chest Pain, Difficulty Breathing Lying Down, Leg Cramps, Palpitations, Rapid Heart Rate, Shortness of Breath and Swelling of Extremities. Gastrointestinal Not Present- Abdominal Pain, Bloating, Bloody Stool, Change in Bowel Habits, Chronic  diarrhea, Constipation, Difficulty Swallowing, Excessive gas, Gets full quickly at meals, Hemorrhoids, Indigestion, Nausea, Rectal Pain and Vomiting.  Vitals  Weight: 102 lb Height: 55.5in Body Surface Area: 1.33 m Body Mass Index: 23.28 kg/m  Temp.: 98.64F  Pulse: 95 (Regular)  BP: 118/60 (Sitting, Left Arm, Standard)    Physical Exam  General Mental Status-Alert. General Appearance-Consistent with stated age. Hydration-Well hydrated. Voice-Normal. Note: BMI 23   Head and Neck Trachea-midline. Thyroid Gland Characteristics - normal size and consistency. Note: Supple. There appears to be bilateral supraclavicular lymph nodes about 1.5 cm in diameter. Skin healthy. No oral lesions. Trachea midline   Eye Eyeball - Bilateral-Extraocular movements intact. Sclera/Conjunctiva - Bilateral-No scleral icterus.  Chest and Lung Exam Chest and lung exam reveals -quiet, even and easy respiratory effort with no use of accessory muscles and on auscultation, normal breath sounds, no adventitious sounds and normal vocal resonance. Inspection Chest Wall - Normal. Back - normal. Note: Tubes centimeter mobile right axillary mass, deep. 1.5 cm left axillary mass, deep. Nontender. No arm swelling.   Cardiovascular Cardiovascular examination reveals -normal heart sounds, regular rate and rhythm with no murmurs and normal pedal pulses bilaterally.  Abdomen Inspection Inspection of the abdomen reveals - No Hernias. Skin - Scar - no surgical scars. Palpation/Percussion Palpation and Percussion of the abdomen reveal - Soft, Non Tender, No Rebound tenderness, No Rigidity (guarding) and No hepatosplenomegaly. Auscultation Auscultation of the abdomen reveals - Bowel sounds normal. Note: Abdomen is nontender but she guards voluntarily making it difficult to rule out organomegaly. No scars or hernias.   Neurologic Neurologic evaluation reveals -alert and  oriented x 3 with no impairment of recent or remote memory. Mental Status-Normal.  Musculoskeletal Normal Exam - Left-Upper Extremity Strength Normal and Lower Extremity Strength Normal. Normal Exam - Right-Upper Extremity Strength Normal and Lower Extremity Strength Normal.  Lymphatic Note: Bilateral supraclavicular lymph nodes, medial third of clavicle, mobile and nontender, 1.5 cm. 2 cm right axillary lymph node and 1.5 cm left axillary lymph node, mobile. 3 cm somewhat fixed left inguinal mass. A little bit tender overlying skin okay. Small right femoral lymph node     Assessment & Plan LYMPHADENOPATHY, GENERALIZED (R59.1)   Your CT scans show abnormally enlarged lymph nodes in your groins, abdomen, under arms and spleen. We can feel some of these lymph nodes on physical exam, as we discussed today There is a concern that she may have a lymphoma A small operation will be required to remove one of these lymph nodes so the proper tests can be done and decision can be made about her treatment  you'll be scheduled for right axillary deep lymph node excisional biopsy We have discussed the indications, techniques, and risk of this surgery in detail  SPLENIC MASS (R16.1) HYPERTENSION, BENIGN (I10) WEIGHT LOSS, ABNORMAL (R63.4) Impression: 20 pounds in 2 years. Unintentional. Associated with anorexia   Edsel Petrin. Dalbert Batman, M.D., [facility] General and Minimally invasive Surgery Breast and Colorectal Surgery Office:   [phone] Pager:   [phone]

## 2017-02-20 ENCOUNTER — Encounter (HOSPITAL_COMMUNITY)
Admission: RE | Disposition: A | Discharge status: Home / Self Care | Payer: Self-pay | Source: Ambulatory Visit | Attending: General Surgery

## 2017-02-20 ENCOUNTER — Ambulatory Visit (HOSPITAL_COMMUNITY): Payer: Medicare Other | Admitting: Anesthesiology

## 2017-02-20 ENCOUNTER — Ambulatory Visit (HOSPITAL_COMMUNITY): Payer: Medicare Other | Admitting: Vascular Surgery

## 2017-02-20 ENCOUNTER — Encounter (HOSPITAL_COMMUNITY): Payer: Self-pay | Admitting: Surgery

## 2017-02-20 ENCOUNTER — Ambulatory Visit (HOSPITAL_COMMUNITY)
Admission: RE | Admit: 2017-02-20 | Discharge: 2017-02-20 | Disposition: A | Discharge status: Home / Self Care | Payer: Medicare Other | Source: Ambulatory Visit | Attending: General Surgery | Admitting: General Surgery

## 2017-02-20 DIAGNOSIS — C773 Secondary and unspecified malignant neoplasm of axilla and upper limb lymph nodes: Secondary | ICD-10-CM | POA: Diagnosis not present

## 2017-02-20 DIAGNOSIS — E785 Hyperlipidemia, unspecified: Secondary | ICD-10-CM | POA: Diagnosis not present

## 2017-02-20 DIAGNOSIS — Z800 Family history of malignant neoplasm of digestive organs: Secondary | ICD-10-CM | POA: Insufficient documentation

## 2017-02-20 DIAGNOSIS — I10 Essential (primary) hypertension: Secondary | ICD-10-CM | POA: Insufficient documentation

## 2017-02-20 DIAGNOSIS — C801 Malignant (primary) neoplasm, unspecified: Secondary | ICD-10-CM | POA: Insufficient documentation

## 2017-02-20 DIAGNOSIS — Z79899 Other long term (current) drug therapy: Secondary | ICD-10-CM | POA: Insufficient documentation

## 2017-02-20 DIAGNOSIS — R591 Generalized enlarged lymph nodes: Secondary | ICD-10-CM | POA: Diagnosis present

## 2017-02-20 DIAGNOSIS — Z87891 Personal history of nicotine dependence: Secondary | ICD-10-CM | POA: Insufficient documentation

## 2017-02-20 DIAGNOSIS — R630 Anorexia: Secondary | ICD-10-CM | POA: Insufficient documentation

## 2017-02-20 DIAGNOSIS — E039 Hypothyroidism, unspecified: Secondary | ICD-10-CM | POA: Insufficient documentation

## 2017-02-20 DIAGNOSIS — R599 Enlarged lymph nodes, unspecified: Secondary | ICD-10-CM

## 2017-02-20 DIAGNOSIS — R634 Abnormal weight loss: Secondary | ICD-10-CM | POA: Insufficient documentation

## 2017-02-20 HISTORY — PX: MASS EXCISION: SHX2000

## 2017-02-20 SURGERY — EXCISION MASS
Anesthesia: General | Site: Axilla | Laterality: Right

## 2017-02-20 MED ORDER — 0.9 % SODIUM CHLORIDE (POUR BTL) OPTIME
TOPICAL | Status: DC | PRN
  Administered 2017-02-20: 1000 mL

## 2017-02-20 MED ORDER — EPHEDRINE SULFATE 50 MG/ML IJ SOLN
INTRAMUSCULAR | Status: DC | PRN
  Administered 2017-02-20: 10 mg via INTRAVENOUS

## 2017-02-20 MED ORDER — CHLORHEXIDINE GLUCONATE CLOTH 2 % EX PADS: 6.0000 | MEDICATED_PAD | Freq: Once | CUTANEOUS | Status: DC

## 2017-02-20 MED ORDER — SODIUM CHLORIDE 0.9 % IV SOLN: 250.0000 mL | INTRAVENOUS | Status: DC | PRN

## 2017-02-20 MED ORDER — HYDROCODONE-ACETAMINOPHEN 5-325 MG PO TABS: 1.0000 | ORAL_TABLET | Freq: Four times a day (QID) | ORAL | 0 refills | Status: DC | PRN

## 2017-02-20 MED ORDER — LIDOCAINE HCL (CARDIAC) 20 MG/ML IV SOLN
INTRAVENOUS | Status: DC | PRN
  Administered 2017-02-20: 100 mg via INTRAVENOUS

## 2017-02-20 MED ORDER — ONDANSETRON HCL 4 MG/2ML IJ SOLN
INTRAMUSCULAR | Status: DC | PRN
  Administered 2017-02-20: 4 mg via INTRAVENOUS

## 2017-02-20 MED ORDER — HYDROMORPHONE HCL 1 MG/ML IJ SOLN
0.2500 mg | INTRAMUSCULAR | Status: DC | PRN
  Administered 2017-02-20: 0.5 mg via INTRAVENOUS

## 2017-02-20 MED ORDER — ACETAMINOPHEN 325 MG PO TABS: 650.0000 mg | ORAL_TABLET | ORAL | Status: DC | PRN

## 2017-02-20 MED ORDER — HYDROMORPHONE HCL 1 MG/ML IJ SOLN
INTRAMUSCULAR | Status: AC
  Filled 2017-02-20: qty 0.5

## 2017-02-20 MED ORDER — PROPOFOL 10 MG/ML IV BOLUS
INTRAVENOUS | Status: AC
  Filled 2017-02-20: qty 20

## 2017-02-20 MED ORDER — EPHEDRINE 5 MG/ML INJ
INTRAVENOUS | Status: AC
  Filled 2017-02-20: qty 10

## 2017-02-20 MED ORDER — FENTANYL CITRATE (PF) 250 MCG/5ML IJ SOLN
INTRAMUSCULAR | Status: AC
  Filled 2017-02-20: qty 5

## 2017-02-20 MED ORDER — OXYCODONE HCL 5 MG PO TABS: 5.0000 mg | ORAL_TABLET | ORAL | Status: DC | PRN

## 2017-02-20 MED ORDER — LIDOCAINE-EPINEPHRINE (PF) 1 %-1:200000 IJ SOLN
INTRAMUSCULAR | Status: DC | PRN
  Administered 2017-02-20: 6 mL

## 2017-02-20 MED ORDER — LACTATED RINGERS IV SOLN
INTRAVENOUS | Status: DC
  Administered 2017-02-20: 14:00:00 via INTRAVENOUS

## 2017-02-20 MED ORDER — PHENYLEPHRINE HCL 10 MG/ML IJ SOLN
INTRAMUSCULAR | Status: AC
  Filled 2017-02-20: qty 1

## 2017-02-20 MED ORDER — CEFAZOLIN SODIUM-DEXTROSE 2-4 GM/100ML-% IV SOLN
2.0000 g | INTRAVENOUS | Status: AC
  Administered 2017-02-20: 2 g via INTRAVENOUS
  Filled 2017-02-20: qty 100

## 2017-02-20 MED ORDER — EPINEPHRINE PF 1 MG/10ML IJ SOSY
PREFILLED_SYRINGE | INTRAMUSCULAR | Status: AC
  Filled 2017-02-20: qty 10

## 2017-02-20 MED ORDER — MIDAZOLAM HCL 2 MG/2ML IJ SOLN
INTRAMUSCULAR | Status: AC
  Filled 2017-02-20: qty 2

## 2017-02-20 MED ORDER — ACETAMINOPHEN 10 MG/ML IV SOLN: 1000.0000 mg | Freq: Once | INTRAVENOUS | Status: DC | PRN

## 2017-02-20 MED ORDER — LABETALOL HCL 5 MG/ML IV SOLN
INTRAVENOUS | Status: AC
  Filled 2017-02-20: qty 4

## 2017-02-20 MED ORDER — LABETALOL HCL 5 MG/ML IV SOLN: 5.0000 mg | INTRAVENOUS | Status: DC | PRN

## 2017-02-20 MED ORDER — PROPOFOL 10 MG/ML IV BOLUS
INTRAVENOUS | Status: DC | PRN
  Administered 2017-02-20: 100 mg via INTRAVENOUS

## 2017-02-20 MED ORDER — FENTANYL CITRATE (PF) 100 MCG/2ML IJ SOLN
INTRAMUSCULAR | Status: DC | PRN
  Administered 2017-02-20: 50 ug via INTRAVENOUS
  Administered 2017-02-20: 25 ug via INTRAVENOUS

## 2017-02-20 MED ORDER — ACETAMINOPHEN 650 MG RE SUPP: 650.0000 mg | RECTAL | Status: DC | PRN

## 2017-02-20 MED ORDER — ONDANSETRON HCL 4 MG/2ML IJ SOLN
INTRAMUSCULAR | Status: AC
  Filled 2017-02-20: qty 2

## 2017-02-20 MED ORDER — MIDAZOLAM HCL 5 MG/5ML IJ SOLN
INTRAMUSCULAR | Status: DC | PRN
  Administered 2017-02-20: 2 mg via INTRAVENOUS

## 2017-02-20 MED ORDER — LACTATED RINGERS IV SOLN: INTRAVENOUS | Status: DC

## 2017-02-20 MED ORDER — SODIUM CHLORIDE 0.9% FLUSH: 3.0000 mL | Freq: Two times a day (BID) | INTRAVENOUS | Status: DC

## 2017-02-20 MED ORDER — BUPIVACAINE HCL (PF) 0.5 % IJ SOLN
INTRAMUSCULAR | Status: AC
  Filled 2017-02-20: qty 30

## 2017-02-20 MED ORDER — PROMETHAZINE HCL 25 MG/ML IJ SOLN: 6.2500 mg | INTRAMUSCULAR | Status: DC | PRN

## 2017-02-20 MED ORDER — MEPERIDINE HCL 25 MG/ML IJ SOLN: 6.2500 mg | INTRAMUSCULAR | Status: DC | PRN

## 2017-02-20 MED ORDER — SODIUM CHLORIDE 0.9% FLUSH: 3.0000 mL | INTRAVENOUS | Status: DC | PRN

## 2017-02-20 MED ORDER — LIDOCAINE 2% (20 MG/ML) 5 ML SYRINGE
INTRAMUSCULAR | Status: AC
  Filled 2017-02-20: qty 10

## 2017-02-20 MED ORDER — ROCURONIUM BROMIDE 10 MG/ML (PF) SYRINGE
PREFILLED_SYRINGE | INTRAVENOUS | Status: AC
  Filled 2017-02-20: qty 5

## 2017-02-20 MED ORDER — FENTANYL CITRATE (PF) 100 MCG/2ML IJ SOLN: 25.0000 ug | INTRAMUSCULAR | Status: DC | PRN

## 2017-02-20 MED ORDER — EPHEDRINE 5 MG/ML INJ
INTRAVENOUS | Status: AC
  Filled 2017-02-20: qty 20

## 2017-02-20 SURGICAL SUPPLY — 46 items
APPLIER CLIP 11 MED OPEN (CLIP) ×3
BENZOIN TINCTURE PRP APPL 2/3 (GAUZE/BANDAGES/DRESSINGS) IMPLANT
CANISTER SUCT 3000ML PPV (MISCELLANEOUS) ×3 IMPLANT
CHLORAPREP W/TINT 26ML (MISCELLANEOUS) ×3 IMPLANT
CLIP APPLIE 11 MED OPEN (CLIP) ×1 IMPLANT
CLOSURE WOUND 1/2 X4 (GAUZE/BANDAGES/DRESSINGS)
CONT SPEC 4OZ CLIKSEAL STRL BL (MISCELLANEOUS) ×3 IMPLANT
COVER SURGICAL LIGHT HANDLE (MISCELLANEOUS) ×3 IMPLANT
DECANTER SPIKE VIAL GLASS SM (MISCELLANEOUS) IMPLANT
DERMABOND ADVANCED (GAUZE/BANDAGES/DRESSINGS) ×2
DERMABOND ADVANCED .7 DNX12 (GAUZE/BANDAGES/DRESSINGS) ×1 IMPLANT
DRAPE LAPAROTOMY 100X72 PEDS (DRAPES) ×3 IMPLANT
DRAPE UTILITY XL STRL (DRAPES) ×6 IMPLANT
ELECT CAUTERY BLADE 6.4 (BLADE) ×3 IMPLANT
ELECT REM PT RETURN 9FT ADLT (ELECTROSURGICAL) ×3
ELECTRODE REM PT RTRN 9FT ADLT (ELECTROSURGICAL) ×1 IMPLANT
GAUZE SPONGE 4X4 12PLY STRL (GAUZE/BANDAGES/DRESSINGS) IMPLANT
GAUZE SPONGE 4X4 16PLY XRAY LF (GAUZE/BANDAGES/DRESSINGS) ×3 IMPLANT
GLOVE EUDERMIC 7 POWDERFREE (GLOVE) ×3 IMPLANT
GOWN STRL REUS W/ TWL LRG LVL3 (GOWN DISPOSABLE) ×1 IMPLANT
GOWN STRL REUS W/ TWL XL LVL3 (GOWN DISPOSABLE) ×1 IMPLANT
GOWN STRL REUS W/TWL LRG LVL3 (GOWN DISPOSABLE) ×2
GOWN STRL REUS W/TWL XL LVL3 (GOWN DISPOSABLE) ×2
KIT ROOM TURNOVER OR (KITS) ×3 IMPLANT
NEEDLE HYPO 25GX1X1/2 BEV (NEEDLE) ×3 IMPLANT
NS IRRIG 1000ML POUR BTL (IV SOLUTION) ×3 IMPLANT
PACK SURGICAL SETUP 50X90 (CUSTOM PROCEDURE TRAY) ×3 IMPLANT
PAD ARMBOARD 7.5X6 YLW CONV (MISCELLANEOUS) ×6 IMPLANT
PENCIL BUTTON HOLSTER BLD 10FT (ELECTRODE) ×3 IMPLANT
SPECIMEN JAR SMALL (MISCELLANEOUS) IMPLANT
SPONGE LAP 4X18 X RAY DECT (DISPOSABLE) IMPLANT
STAPLER VISISTAT 35W (STAPLE) IMPLANT
STRIP CLOSURE SKIN 1/2X4 (GAUZE/BANDAGES/DRESSINGS) IMPLANT
SUT MNCRL AB 4-0 PS2 18 (SUTURE) ×3 IMPLANT
SUT VIC AB 2-0 BRD 54 (SUTURE) ×3 IMPLANT
SUT VIC AB 3-0 SH 18 (SUTURE) ×3 IMPLANT
SUT VIC AB 3-0 SH 27 (SUTURE) ×2
SUT VIC AB 3-0 SH 27XBRD (SUTURE) ×1 IMPLANT
SYR BULB 3OZ (MISCELLANEOUS) ×3 IMPLANT
SYR CONTROL 10ML LL (SYRINGE) ×3 IMPLANT
TOWEL OR 17X24 6PK STRL BLUE (TOWEL DISPOSABLE) IMPLANT
TOWEL OR 17X26 10 PK STRL BLUE (TOWEL DISPOSABLE) ×3 IMPLANT
TUBE CONNECTING 12'X1/4 (SUCTIONS) ×1
TUBE CONNECTING 12X1/4 (SUCTIONS) ×2 IMPLANT
WATER STERILE IRR 1000ML POUR (IV SOLUTION) IMPLANT
YANKAUER SUCT BULB TIP NO VENT (SUCTIONS) ×3 IMPLANT

## 2017-02-20 NOTE — Anesthesia Procedure Notes (Signed)
Procedure Name: LMA Insertion Date/Time: 02/20/2017 3:56 PM Performed by: Valda Favia Pre-anesthesia Checklist: Patient identified, Emergency Drugs available, Suction available, Patient being monitored and Timeout performed Patient Re-evaluated:Patient Re-evaluated prior to inductionOxygen Delivery Method: Circle system utilized Preoxygenation: Pre-oxygenation with 100% oxygen Intubation Type: IV induction LMA: LMA inserted LMA Size: 4.0 Number of attempts: 1 Placement Confirmation: positive ETCO2 and breath sounds checked- equal and bilateral Tube secured with: Tape Dental Injury: Teeth and Oropharynx as per pre-operative assessment

## 2017-02-20 NOTE — Anesthesia Preprocedure Evaluation (Signed)
Anesthesia Evaluation  Patient identified by MRN, date of birth, ID band Patient awake    Reviewed: Allergy & Precautions, NPO status , Patient's Chart, lab work & pertinent test results  Airway Mallampati: I       Dental no notable dental hx.    Pulmonary neg pulmonary ROS,    Pulmonary exam normal        Cardiovascular hypertension, Pt. on medications Normal cardiovascular exam Rhythm:Regular Rate:Normal     Neuro/Psych negative neurological ROS  negative psych ROS   GI/Hepatic negative GI ROS, Neg liver ROS,   Endo/Other  Hypothyroidism   Renal/GU negative Renal ROS  negative genitourinary   Musculoskeletal negative musculoskeletal ROS (+)   Abdominal Normal abdominal exam  (+)   Peds  Hematology negative hematology ROS (+)   Anesthesia Other Findings   Reproductive/Obstetrics                             Anesthesia Physical Anesthesia Plan  ASA: II  Anesthesia Plan: General   Post-op Pain Management:    Induction: Intravenous  Airway Management Planned: Oral ETT  Additional Equipment:   Intra-op Plan:   Post-operative Plan: Extubation in OR  Informed Consent: I have reviewed the patients History and Physical, chart, labs and discussed the procedure including the risks, benefits and alternatives for the proposed anesthesia with the patient or authorized representative who has indicated his/her understanding and acceptance.     Plan Discussed with:   Anesthesia Plan Comments:         Anesthesia Quick Evaluation

## 2017-02-20 NOTE — Discharge Instructions (Signed)
Ice pack to wound, intermittently, for 24 hours Okay to shower in 48 hours No tub baths for 3 weeks  Move your shoulder around frequently to avoid stiffness  Call if any signs of infection, swelling, extreme pain  The report should be out by next Tuesday and we will call you The pathology report will be sent to Dr. Carlis Abbott and to Dr. Burr Medico.

## 2017-02-20 NOTE — Transfer of Care (Signed)
Immediate Anesthesia Transfer of Care Note  Patient: Penny Stewart  Procedure(s) Performed: Procedure(s): EXCISION DEEP AXILLARY MASS (N/A)  Patient Location: PACU  Anesthesia Type:General  Level of Consciousness: awake, alert , oriented and patient cooperative  Airway & Oxygen Therapy: Patient Spontanous Breathing and Patient connected to nasal cannula oxygen  Post-op Assessment: Report given to RN and Post -op Vital signs reviewed and stable  Post vital signs: Reviewed and stable  Last Vitals:  Vitals:   02/20/17 1325 02/20/17 1645  BP: 128/67 (!) 154/71  Pulse: 98 85  Resp: 20 15  Temp: 36.7 C 36.1 C    Last Pain:  Vitals:   02/20/17 1645  TempSrc:   PainSc: 0-No pain         Complications: No apparent anesthesia complications

## 2017-02-20 NOTE — Op Note (Signed)
Patient Name:           Penny Stewart   Date of Surgery:        02/20/2017  Pre op Diagnosis:      Diffuse lymphadenopathy, suspect lymphoma  Post op Diagnosis:    Same  Procedure:                 Excision deep right axillary lymph node  Surgeon:                     Edsel Petrin. Dalbert Batman, M.D., FACS  Assistant:                      OR staff   Indication for Assistant: n/a  Operative Indications:   . This is a 71 year old woman who is here with her husband to discuss right axillary lymph node biopsy. She is referred by Dr. Burr Medico. Her PCP is Jeanann Lewandowsky.      She has noticed a lump in her left groin for 3 or 4 weeks. It's a little tender but no pain at rest. Denies trauma or inflammation or drainage. She reports a 20 pound weight loss over 2 years. Increasing fatigue and anorexia. Denies skin rash night sweats. A CT scan shows extensive retroperitoneal, left inguinal, right femoral, and mesenteric lymphadenopathy, concerning for malignancy. 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 Lab work shows hemoglobin 12.3, WBC 13,400, platelet count 217,000. Creatinine 0.8. LDH 252. Liver tests normal.      Past history reveals hypothyroidism, hypertension, hyperlipidemia. She has never had surgery.      Family history mother deceased age 36 with colon cancer. Father deceased age 66 with heart problems. All 10  siblings have deceased. One had bile duct cancer.  Doesn't know too much about the history there..       She is scheduled for right axillary deep lymph node excisional biopsy this coming Thursday, 2 days from now. g. I discussed the indications, details, techniques, and numerous risk of the surgery with her and her husband. She is aware the risks of bleeding, infection, hematoma, nerve damage with chronic pain or numbness under the arm, arm swelling, shoulder disability, and other unforeseen problems. She understands all these issues. All of her questions are  answered. She agrees with this plan.       I told her that if this turned out to be lymphoma that she might need chemotherapy. I told her that I would standby to insert a Port-A-Cath which she is familiar with, should that be necessary.  Operative Findings:       There was a 2 cm firm lymph node encased in chronic scar tissue behind the pectoralis major muscle.  It was not invading the muscle or the chest wall, however.  It was a little bit more firm than the usual lymphoma  Procedure in Detail:          Following the induction of general LMA anesthesia the patient's right axilla, arm chest and posterior chest wall were prepped and draped in a sterile fashion.  Intravenous antibiotic were given.  Surgical timeout was performed.  0.5% Marcaine was used as a local infiltration anesthetic.     A transverse incision was made in the right axillary skin at the hairline.  Dissection was carried down through the clavipectoral fascia.  The lymph node was palpated behind the lateral border of the pectoralis major muscle.  Dissection was carried up  into that area and it was slowly dissected away from the surrounding tissues.  Tissue attachments were controlled with  metal clips and some were clamped divided and ligated with 2-0 Vicryl ties.  Specimen was removed and sent fresh to the lab.  This was received by their physicians assistant, Joellen Jersey who is going to process it for lymphoma.  The wound was irrigated with saline.  Hemostasis was excellent.  The clavipectoral fascia was closed with 3-0 Vicryl sutures and the skin closed with a running subcuticular 4-0 Monocryl and Dermabond.  The patient tolerated the procedure well was taken to PACU in stable condition.  EBL 20 mL.  Counts correct.  Complications none.     Edsel Petrin. Dalbert Batman, M.D., FACS General and Minimally Invasive Surgery Breast and Colorectal Surgery  02/20/2017 4:41 PM

## 2017-02-20 NOTE — Anesthesia Postprocedure Evaluation (Addendum)
Anesthesia Post Note  Patient: Penny Stewart  Procedure(s) Performed: Procedure(s) (LRB): EXCISION DEEP AXILLARY MASS (Right)  Patient location during evaluation: PACU Anesthesia Type: General Level of consciousness: awake and sedated Pain management: pain level controlled Vital Signs Assessment: post-procedure vital signs reviewed and stable Respiratory status: spontaneous breathing Cardiovascular status: stable Postop Assessment: no signs of nausea or vomiting Anesthetic complications: no        Last Vitals:  Vitals:   02/20/17 1645 02/20/17 1700  BP: (!) 154/71 (!) 151/65  Pulse: 85 83  Resp: 15 19  Temp: 36.1 C     Last Pain:  Vitals:   02/20/17 1645  TempSrc:   PainSc: 0-No pain   Pain Goal:                 Jama Mcmiller JR,JOHN Lashell Moffitt

## 2017-02-20 NOTE — Interval H&P Note (Signed)
History and Physical Interval Note:  02/20/2017 1:34 PM  Penny Stewart  has presented today for surgery, with the diagnosis of High grade lymphoma  The various methods of treatment have been discussed with the patient and family. After consideration of risks, benefits and other options for treatment, the patient has consented to  Procedure(s): EXCISION DEEP AXILLARY MASS (N/A),RIGHT, as a surgical intervention .  The patient's history has been reviewed, patient examined, no change in status, stable for surgery.  I have reviewed the patient's chart and labs.  Questions were answered to the patient's satisfaction.     Adin Hector

## 2017-02-21 ENCOUNTER — Encounter (HOSPITAL_COMMUNITY): Payer: Self-pay | Admitting: General Surgery

## 2017-02-24 ENCOUNTER — Ambulatory Visit (HOSPITAL_COMMUNITY): Payer: Medicare Other

## 2017-02-26 ENCOUNTER — Telehealth: Payer: Self-pay | Admitting: *Deleted

## 2017-02-26 NOTE — Progress Notes (Signed)
[facility]  [phone] [phone]  Clinic Follow Up Note   Patient Care Team: Foye Spurling, MD as PCP - General (Internal Medicine) 02/27/2017  CHIEF COMPLAINTS/PURPOSE OF CONSULTATION:  Metastatic lung cancer    Metastatic lung cancer (metastasis from lung to other site), unspecified laterality ([facility])   02/07/2017 Imaging    CT AP IMPRESSION: 1. Extensive retroperitoneal, left inguinal, right femoral, and mesenteric lymphadenopathy highly concerning for malignancy and which may reflect metastatic disease or lymphoma. No clear primary malignancy identified in the abdomen or pelvis. 2. Multiple splenic masses consistent with metastases. 3. Aortic atherosclerosis. These results will be called to the ordering clinician or representative by the Radiologist Assistant, and communication documented in the PACS or zVision Dashboard.      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 Microscopic Comment The lymph node is replaced by malignant cells. Immunohistochemistry reveals the cells are positive for cytokeratin 7 and TTF-1. p63 has weak non-specific staining. The cells are negative for cytokeratin 20, S100, GCDFP, ER, GATA3, cytokeratin 5/6, CD30, CD3, CD20, CD43, CD45, and ALK. The immunohistochemical profile is consistent with a primary lung adenocarcinoma. There is sufficient tissue for molecular testing if requested (block 1C). Correlation with radiologic data is recommended. Dr. Lyndon Code has reviewed the case. The case was called to Abigail Butts at Dr. Darrel Hoover office on 02/25/2017.       02/20/2017 Surgery    Excision deep axillary mass with Dr. Dalbert Batman      02/27/2017 Initial Diagnosis    Metastatic lung cancer (metastasis from lung to other site), unspecified laterality (Penny Stewart)      02/27/2017 PET scan    IMPRESSION: 1. Hypermetabolic adenopathy in the chest,  abdomen, and pelvis, with several hypermetabolic pulmonary nodules ; a faintly rim hypermetabolic splenic mass; and a questionably hypermetabolic lesion in the right iliac bone. The right axillary lymph node biopsy performed last week had immunohistochemical profile consistent with primary lung adenocarcinoma, and accordingly metastatic lung cancer is favored. 2. Possible metastatic involvement of the brain, there is abnormal white matter hypo density in the right cerebral hemisphere along with potential effacement of the anterior horn of the right lateral ventricle. MRI brain with and without contrast is recommended.        HISTORY OF PRESENTING ILLNESS:  Penny Stewart is a 71 year old woman referred for evaluation of lymphadenopathy. She reports weight loss over the past one to 2 years. Her husband reports more weight loss recently. For the past 2 months she has noted increasing fatigue and  in general not feeling well. 3-4 weeks ago she noted a "hard spot" in the left groin. She saw Dr. Carlis Abbott and was referred for CT scans of the abdomen and pelvis. She was found to have extensive retroperitoneal, left inguinal, right femoral and mesenteric lymphadenopathy. Multiple splenic masses were noted. She was referred to the [facility] for further evaluation.  MEDICAL HISTORY:  1. Hypothyroidism 2. Hypertension 3. Hypercholesterolemia  CURRENT THERAPY: Pending systemic therapy   INTERVAL HISTORY: Penny Stewart returns to the clinic today for follow up. She is joined today by her husband, son, and daughter in Sports coach. The patient recently had a biopsy; she reports the procedure was fine. She reports difficulty raising her right arm as well as some soreness to the area. The patient reports no health changes since her last follow up appointment. She denies cough or shortness of breath. She  denies headaches or vision changes. She reports her energy and appetite are low, though her husband makes her  eat. She has lost approximately 20 lbs in the last few months.  The patient has been taking OTC B12 vitamins for the last week.   The patient has a 15 year smoking history of 2-3 cigarettes daily. She quit in 2015/16. She has a glass of wine 1-2 x a week.   SURGICAL HISTORY: Past Surgical History:  Procedure Laterality Date  . MASS EXCISION Right 02/20/2017   Procedure: EXCISION DEEP AXILLARY MASS;  Surgeon: Fanny Skates, MD;  Location: [facility];  Service: General;  Laterality: Right;   SOCIAL HISTORY: Social History   Social History  . Marital status: Married    Spouse name: N/A  . Number of children: N/A  . Years of education: N/A   Occupational History  . Not on file.   Social History Main Topics  . Smoking status: Former Smoker    Packs/day: 15.00    Years: 0.10    Quit date: 10/28/2014  . Smokeless tobacco: Never Used  . Alcohol use 0.6 oz/week    1 Glasses of wine per week     Comment: 1-2 times a week   . Drug use: No  . Sexual activity: Not on file   Other Topics Concern  . Not on file   Social History Narrative  . No narrative on file   She lives in [place]. She is married. She has one son in good health. She is retired from [employer]. She quit smoking 2 years ago, less than 1 pack per day for greater than 25 years. Every "2 or so days" she either has a glass of wine or a shot of cognac.  FAMILY HISTORY: Family History  Problem Relation Age of Onset  . Cancer Mother 21    colon cancer   . Cancer Sister 8    breast cancer    Mother deceased age 14 with colon cancer. Father deceased age 55 with heart problems. 10 siblings, all deceased. One sister died with "bile duct cancer". She thinks another sister may have had cancer as well.  ALLERGIES:  No known drug allergies  MEDICATIONS:  Current Outpatient Prescriptions  Medication Sig Dispense Refill  . amLODipine (NORVASC) 10 MG tablet Take 10 mg by mouth daily at 12 noon.     [name] aspirin EC  81 MG tablet Take 81 mg by mouth daily.    . B COMPLEX-C PO Place 2 drops under the tongue daily. B Complex liquid    . levothyroxine (SYNTHROID, LEVOTHROID) 75 MCG tablet Take 75 mcg by mouth daily before breakfast.    . mirtazapine (REMERON) 15 MG tablet Take 15 mg by mouth at bedtime.    [name] atorvastatin (LIPITOR) 20 MG tablet Take 20 mg by mouth at bedtime.     [name] HYDROcodone-acetaminophen (NORCO) 5-325 MG tablet Take 1-2 tablets by mouth every 6 (six) hours as needed for moderate pain or severe pain. (Patient not taking: Reported on 02/27/2017) 30 tablet 0   No current facility-administered medications for this visit.     REVIEW OF SYSTEMS:   Constitutional: Denies fevers, chills or abnormal night sweats. (+) Decreased appetite. (+) Poor energy level. (+) 20 lb weight loss Eyes: No vision change. No diplopia. Ears, nose, mouth, throat, and face: Denies mucositis or sore throat Respiratory: Denies cough, dyspnea or wheezes Cardiovascular: Denies palpitation, chest discomfort or lower extremity swelling Gastrointestinal:  Occasional mild nausea with certain scents. No dysphagia. Intermittent mild constipation. No bloody or black stools. Skin: Denies abnormal skin rashes Lymphatics: "Hard spot" left groin. Neurological: Denies numbness, tingling in the extremities. GU: No hematuria or dysuria. Behavioral/Psych: Husband has noted recent memory issues.  All other systems were reviewed with the patient and are negative.  PHYSICAL EXAMINATION:  Vitals:   02/27/17 1541  BP: 119/60  Pulse: 94  Resp: 18  Temp: 98.7 F (37.1 C)   Filed Weights   02/27/17 1541  Weight: 103 lb 8 oz (46.9 kg)    GENERAL:alert, no distress and comfortable SKIN: Pale appearing. No rashes or significant lesions EYES: normal, conjunctiva are pink and non-injected, sclera clear OROPHARYNX:no exudate, no erythema and lips, buccal mucosa, and tongue normal  NECK: supple, thyroid normal size, non-tender, without  nodularity LYMPH: Surgical scar in the right axilla after biopsy. 1.5 cm left axillary lymph node; 3 cm left inguinal lymph node; 2 cm right femoral lymph node Right axilla shows large incision which is healing well without sign of infection. LUNGS: clear to auscultation and percussion with normal breathing effort HEART: regular rate & rhythm and no murmurs and no lower extremity edema ABDOMEN:abdomen soft, non-tender and normal bowel sounds; no organomegaly. Musculoskeletal:no cyanosis of digits and no clubbing  PSYCH: alert & oriented x 3 with fluent speech NEURO: no focal motor/sensory deficits BREAST: No mass palpated in either breast  LABORATORY DATA:  CBC Latest Ref Rng & Units 02/18/2017 02/13/2017  WBC 4.0 - 10.5 K/uL 12.9(H) 13.4(H)  Hemoglobin 12.0 - 15.0 g/dL 12.0 12.3  Hematocrit 36.0 - 46.0 % 37.7 38.0  Platelets 150 - 400 K/uL 245 217   CMP Latest Ref Rng & Units 02/18/2017 02/13/2017  Glucose 65 - 99 mg/dL 87 109  BUN 6 - 20 mg/dL 18 15.0  Creatinine 0.44 - 1.00 mg/dL 0.63 0.8  Sodium 135 - 145 mmol/L 140 142  Potassium 3.5 - 5.1 mmol/L 3.9 4.2  Chloride 101 - 111 mmol/L 105 -  CO2 22 - 32 mmol/L 25 28  Calcium 8.9 - 10.3 mg/dL 9.5 9.8  Total Protein 6.5 - 8.1 g/dL 7.2 7.6  Total Bilirubin 0.3 - 1.2 mg/dL 0.4 0.34  Alkaline Phos 38 - 126 U/L 111 136  AST 15 - 41 U/L 22 20  ALT 14 - 54 U/L 11(L) 10   PATHOLOGY Diagnosis 02/20/17 Lymph node for lymphoma, 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 Microscopic Comment The lymph node is replaced by malignant cells. Immunohistochemistry reveals the cells are positive for cytokeratin 7 and TTF-1. p63 has weak non-specific staining. The cells are negative for cytokeratin 20, S100, GCDFP, ER, GATA3, cytokeratin 5/6, CD30, CD3, CD20, CD43, CD45, and ALK. The immunohistochemical profile is consistent with a primary lung adenocarcinoma. There is sufficient tissue for molecular testing if requested (block  1C). Correlation with radiologic data is recommended. Dr. Lyndon Code has reviewed the case. The case was called to Abigail Butts at Dr. Darrel Hoover office on 02/25/2017.   RADIOGRAPHIC STUDIES: I have personally reviewed the radiological images as listed and agreed with the findings in the report. Ct Abdomen Pelvis W Contrast  Result Date: 02/07/2017 CLINICAL DATA:  Left inguinal mass.  Weight loss. EXAM: CT ABDOMEN AND PELVIS WITH CONTRAST TECHNIQUE: Multidetector CT imaging of the abdomen and pelvis was performed using the standard protocol following bolus administration of intravenous contrast. CONTRAST:  52m ISOVUE-300 IOPAMIDOL (ISOVUE-300) INJECTION 61% COMPARISON:  None. FINDINGS: Lower chest: Small fat containing Bochdalek's  hernia and mild scarring in the left lung base. No pleural effusion. Hepatobiliary: 8 mm hypodensity in the left hepatic lobe, too small to fully characterize. Unremarkable gallbladder. No biliary dilatation. Pancreas: Unremarkable. Spleen: Multiple hypoenhancing splenic masses measuring up to 3.1 cm in size. Adrenals/Urinary Tract: Unremarkable right adrenal gland. Slight left adrenal nodularity. Two subcentimeter low-density lesions in the left kidney, too small to fully characterize. 5.1 x 4.7 cm cyst in the right lower pole. No hydronephrosis. Decompressed bladder. Stomach/Bowel: The stomach is within normal limits. There is no evidence of bowel obstruction. Colonic diverticulosis is noted without evidence of diverticulitis. The appendix is unremarkable. Vascular/Lymphatic: Diffuse abdominal aortic atherosclerosis without aneurysm. There is diffuse retroperitoneal lymphadenopathy. Left para-aortic lymph nodes measure up to 1.7 cm in short axis. Aortocaval lymph nodes measure up to 1.5 cm. An enlarged left pelvic sidewall lymph node measures 1.9 cm. Left external iliac lymph nodes measure up to 1.6 cm. Left inguinal lymph nodes measure up to 2.1 cm. There is a 1.5 cm right femoral lymph node. 2  adjacent lymph nodes/soft tissue nodules in the pelvic mesentery adjacent to the sigmoid colon measure 1.2 x 1.1 cm and 1.6 x 1.2 cm (series 2, images 52 and 55). The mesenteric and some of the retroperitoneal lymph nodes demonstrate heterogeneous hypoenhancement and may be partially necrotic. Some of the inguinal/ femoral lymph nodes appear more hyperenhancing. Reproductive: The uterus and ovaries are unremarkable. Prominent vascular structures are noted in the adnexal bilaterally, particularly on the left with a prominent left gonadal vein noted. Other: No intraperitoneal free fluid.  No abdominal wall hernia. Musculoskeletal: No suspicious osseous lesions identified. IMPRESSION: 1. Extensive retroperitoneal, left inguinal, right femoral, and mesenteric lymphadenopathy highly concerning for malignancy and which may reflect metastatic disease or lymphoma. No clear primary malignancy identified in the abdomen or pelvis. 2. Multiple splenic masses consistent with metastases. 3. Aortic atherosclerosis. These results will be called to the ordering clinician or representative by the Radiologist Assistant, and communication documented in the PACS or zVision Dashboard. Electronically Signed   By: Logan Bores M.D.   On: 02/07/2017 16:22   Nm Pet Image Initial (pi) Skull Base To Thigh  Result Date: 02/27/2017 CLINICAL DATA:  Initial treatment strategy for splenic masses with abdominal and pelvic adenopathy on CT scan. EXAM: NUCLEAR MEDICINE PET SKULL BASE TO THIGH TECHNIQUE: 5.0 mCi F-18 FDG was injected intravenously. Full-ring PET imaging was performed from the skull base to thigh after the radiotracer. CT data was obtained and used for attenuation correction and anatomic localization. FASTING BLOOD GLUCOSE:  Value: 126 mg/dl COMPARISON:  02/07/2017 FINDINGS: NECK The amount of intracranial white matter hypodensity appears excessive, especially in the right temporal lobe, which may have some slightly reduced activity.  Also, there seems to be some potential effacement of the frontal horn of the right lateral ventricle on image 1/4. Metastatic involvement of the brain is suspected although not directly demonstrated. Indistinct lymph nodes in the supraclavicular fossa bilaterally, with a suspected 8 mm left supraclavicular lymph node with maximum SUV 3.2. CHEST Hypermetabolic anterior mediastinal, right paratracheal, subcarinal, right hilar, right infrahilar, than left axillary adenopathy observed. Left axillary lymph node measures 1.5 cm in short axis on image 45/4 and has maximum SUV of 5.9. Indistinctly marginated right paratracheal lymph node has short axis diameter 1.6 cm on image 53/4 and a maximum SUV of 7.6. Postoperative findings from right axillary dissection noted with a small fluid collection in the right subpectoral region, and gas in the axilla.  Centrilobular emphysema. Spiculated right upper lobe 1.9 by 1.3 cm pulmonary nodule on image 29/8, maximum SUV 4.5. 1.8 by 1.4 cm left lower lobe nodule on image 38/8 has lobular margins and a maximum SUV of 6.6. Background mediastinal blood pool activity 2.4. Coronary, aortic arch, and branch vessel atherosclerotic vascular disease. Small pericardial effusion. ABDOMEN/PELVIS The dominant 3.2 cm splenic mass has faint accentuated activity along its margins, maximum SUV 4.0. There is some mild heterogeneity of splenic activity. Background hepatic activity SUV 2.6. Hypermetabolic retroperitoneal and pelvic adenopathy is observed including the left periaortic, aortocaval, right inguinal, left inguinal, and left external iliac chains. The index upper abdominal left periaortic node measures 1.9 cm in short axis on image 96/4 and has maximum SUV 5.4. Index left inguinal lymph node measures 2.4 cm in short axis on image 157/4 and has maximum standard uptake value 6.5. There is also a mesenteric lymph node in the right central pelvis measuring 1.4 cm in short axis on image 138/4,  maximum SUV 4.8. SKELETON Subtle focus of activity in the right iliac bone just above the acetabulum, maximum SUV 4.4. No other definite accentuated osseous activity, and this lesion is questionable. IMPRESSION: 1. Hypermetabolic adenopathy in the chest, abdomen, and pelvis, with several hypermetabolic pulmonary nodules ; a faintly rim hypermetabolic splenic mass; and a questionably hypermetabolic lesion in the right iliac bone. The right axillary lymph node biopsy performed last week had immunohistochemical profile consistent with primary lung adenocarcinoma, and accordingly metastatic lung cancer is favored. 2. Possible metastatic involvement of the brain, there is abnormal white matter hypo density in the right cerebral hemisphere along with potential effacement of the anterior horn of the right lateral ventricle. MRI brain with and without contrast is recommended. Electronically Signed   By: Van Clines M.D.   On: 02/27/2017 12:05    ASSESSMENT & PLAN: Penny Stewart is a 71 y.o. female who presents with:  1. 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  -Her biopsy immunostain showed positive CK 7 and TTF-1 1, which is consistent with lung adenocarcinoma. - We reviewed the PET scan reports and images from this morning with the patient and her family. It showed hypermetabolic right lung lesion with significant right hilar and mediastinal adenopathy, diffuse nodal metastasis, single right pelvic bone metastasis, and a hypermetabolic left lung lesion likely metastatic -We discussed the natural history of lung cancer, an incurable nature of her disease. The goal of therapy is palliative.  - I have requested [facility] and PD-L1 testing to determine the best treatment options for the patient. These results may take 2-3 weeks. I discussed this plan with the patient and she is  in agreement.  - I discussed systemic therapy with the patient and her family today, which include oral targeted therapy, upfront immunotherapy, versus chemotherapy. She does not have heavy smoking history, hope her tumor contains targetable mutation - I will order a brain MRI to rule out brain metastasis. I discussed with the patient that radiotherapy will likely be the course of treatment for brain metastasis. - I offered B12 injections to prepare the patient for chemotherapy should this be the treatment warranted by genetic testing. The patient is in agreement and would like to proceed. I advised her that this will also likely improve her fatigue. - First B12 injection today.  2. Anorexia/Weight Loss - The patient has lost approximately 20  lbs in the last several months. - She reports a decreased appetite. - The patient is currently taking Remeron 15 mg once daily.  - The patient may increase this to 30 mg, or 2 tablets daily, to increase her appetite. - I offered referral to our dietician. The patient is in agreement and would like to be scheduled at her next follow up.  3. Goal of care discussion  -We discussed the incurable nature of her cancer, and the overall poor prognosis, especially if she does not have good response to chemotherapy or progress on chemo -The patient understands the goal of care is palliative. -I recommend DNR/DNI, she will think about it    PLAN: - B12 injection today. - Brain MRI w wo contrast next week  - Patient may take Remeron 30 mg daily as needed for appetite. - Labs, follow up, and chemo class on 5/21 to finalize her treatment plan - First chemo infusion carbo and alimta on 5/23 if indicated.   Orders Placed This Encounter  Procedures  . MR Brain W Wo Contrast    Standing Status:   Future    Standing Expiration Date:   02/27/2018    Scheduling Instructions:     Please schedule asap    Order Specific Question:   If indicated for the ordered  procedure, I authorize the administration of contrast media per Radiology protocol    Answer:   Yes    Order Specific Question:   Reason for Exam (SYMPTOM  OR DIAGNOSIS REQUIRED)    Answer:   rule out brain mets    Order Specific Question:   What is the patient's sedation requirement?    Answer:   No Sedation    Order Specific Question:   Does the patient have a pacemaker or implanted devices?    Answer:   No    Order Specific Question:   Preferred imaging location?    Answer:   [facility] (table limit-350 lbs)    Order Specific Question:   Radiology Contrast Protocol - do NOT remove file path    Answer:   [email or web address]  . CBC with Differential    Standing Status:   Standing    Number of Occurrences:   50    Standing Expiration Date:   02/27/2022  . Comprehensive metabolic panel    Standing Status:   Standing    Number of Occurrences:   50    Standing Expiration Date:   02/27/2022   The patient was encouraged to call or return to the clinic sooner with any questions or concerns.  I spent a total of 45 minutes for this visit, wasn't 50% face-to-face counseling.  This document serves as a record of services personally performed by Truitt Merle, MD. It was created on her behalf by Maryla Morrow, a trained medical scribe. The creation of this record is based on the scribe's personal observations and the provider's statements to them. This document has been checked and approved by the attending provider.    Truitt Merle, MD 02/27/2017

## 2017-02-26 NOTE — Telephone Encounter (Signed)
-----  Message from Truitt Merle, MD sent at 02/26/2017 11:22 AM EDT -----  Janifer Adie,  Could you call radiology and find out why she was not scheduled, and if we can get the PET scheduled for this week,  Thanks  Krista Blue  ----- Message ----- From: Loletta Parish Sent: 02/26/2017  10:47 AM To: Fanny Skates, MD, Jesse Fall, RN, #  PET was authorized on 04/19.  Thanks  ----- Message ----- From: Truitt Merle, MD Sent: 02/25/2017  10:08 PM To: Fanny Skates, MD, Jesse Fall, RN, #  Renelda Loma, thanks for get her biopsy done last week.   Elmyra Ricks, Her PET was ordered on 4/19 to be done ASAP, but it has not scheduled. Did you get it approved?   Zadkiel Dragan, please call nuclear medicine to schedule PET ASAP this week.   Lattie Haw, her biopsy showed metastatic carcinoma, favor lung primary  I need to see her back ASAP after PET  I will order FO and PD-L1  Thanks   Krista Blue

## 2017-02-26 NOTE — Telephone Encounter (Signed)
Unable to reach pt at home #.  Messages left on pt & husband's mobile regarding scan at [facility] @ 10 am tomorrow & NPO after MN.  When no return call received, called son & informed him & he will try to make sure pt gets message.

## 2017-02-27 ENCOUNTER — Ambulatory Visit (HOSPITAL_COMMUNITY)
Admission: RE | Admit: 2017-02-27 | Discharge: 2017-02-27 | Disposition: A | Discharge status: Home / Self Care | Payer: Medicare Other | Source: Ambulatory Visit | Attending: Nurse Practitioner | Admitting: Nurse Practitioner

## 2017-02-27 ENCOUNTER — Ambulatory Visit (HOSPITAL_BASED_OUTPATIENT_CLINIC_OR_DEPARTMENT_OTHER): Payer: Medicare Other | Admitting: Hematology

## 2017-02-27 ENCOUNTER — Encounter: Payer: Self-pay | Admitting: Hematology

## 2017-02-27 ENCOUNTER — Telehealth: Payer: Self-pay | Admitting: Hematology

## 2017-02-27 VITALS — BP 119/60 | HR 94 | Temp 98.7°F | Resp 18 | Ht 59.5 in | Wt 103.5 lb

## 2017-02-27 DIAGNOSIS — R161 Splenomegaly, not elsewhere classified: Secondary | ICD-10-CM | POA: Insufficient documentation

## 2017-02-27 DIAGNOSIS — R591 Generalized enlarged lymph nodes: Secondary | ICD-10-CM | POA: Diagnosis not present

## 2017-02-27 DIAGNOSIS — C7800 Secondary malignant neoplasm of unspecified lung: Secondary | ICD-10-CM

## 2017-02-27 DIAGNOSIS — R918 Other nonspecific abnormal finding of lung field: Secondary | ICD-10-CM | POA: Insufficient documentation

## 2017-02-27 DIAGNOSIS — C3490 Malignant neoplasm of unspecified part of unspecified bronchus or lung: Secondary | ICD-10-CM | POA: Diagnosis not present

## 2017-02-27 DIAGNOSIS — R590 Localized enlarged lymph nodes: Secondary | ICD-10-CM | POA: Insufficient documentation

## 2017-02-27 DIAGNOSIS — C779 Secondary and unspecified malignant neoplasm of lymph node, unspecified: Secondary | ICD-10-CM | POA: Diagnosis not present

## 2017-02-27 DIAGNOSIS — C7951 Secondary malignant neoplasm of bone: Secondary | ICD-10-CM | POA: Diagnosis not present

## 2017-02-27 DIAGNOSIS — R599 Enlarged lymph nodes, unspecified: Secondary | ICD-10-CM

## 2017-02-27 DIAGNOSIS — Z7189 Other specified counseling: Secondary | ICD-10-CM

## 2017-02-27 LAB — GLUCOSE, CAPILLARY: GLUCOSE-CAPILLARY: 126 mg/dL — AB (ref 65–99)

## 2017-02-27 MED ORDER — CYANOCOBALAMIN 1000 MCG/ML IJ SOLN
INTRAMUSCULAR | Status: AC
  Filled 2017-02-27: qty 1

## 2017-02-27 MED ORDER — CYANOCOBALAMIN 1000 MCG/ML IJ SOLN
1000.0000 ug | Freq: Once | INTRAMUSCULAR | Status: AC
  Administered 2017-02-27: 1000 ug via INTRAMUSCULAR

## 2017-02-27 MED ORDER — FLUDEOXYGLUCOSE F - 18 (FDG) INJECTION
5.0000 | Freq: Once | INTRAVENOUS | Status: AC
  Administered 2017-02-27: 5 via INTRAVENOUS

## 2017-02-27 NOTE — Telephone Encounter (Signed)
Gave patient AVS and calender per 5/3 los;[name] MRI to be scheduled with [facility].

## 2017-03-01 ENCOUNTER — Encounter: Payer: Self-pay | Admitting: Hematology

## 2017-03-01 DIAGNOSIS — Z7189 Other specified counseling: Secondary | ICD-10-CM | POA: Insufficient documentation

## 2017-03-11 ENCOUNTER — Telehealth: Payer: Self-pay | Admitting: *Deleted

## 2017-03-11 NOTE — Telephone Encounter (Signed)
Received results of [facility].  Left results on Dr. Ernestina Penna desk for review.

## 2017-03-12 ENCOUNTER — Ambulatory Visit (HOSPITAL_COMMUNITY)
Admission: RE | Admit: 2017-03-12 | Discharge: 2017-03-12 | Disposition: A | Discharge status: Home / Self Care | Payer: Medicare Other | Source: Ambulatory Visit | Attending: Hematology | Admitting: Hematology

## 2017-03-12 ENCOUNTER — Telehealth: Payer: Self-pay | Admitting: *Deleted

## 2017-03-12 ENCOUNTER — Encounter (HOSPITAL_COMMUNITY): Payer: Self-pay

## 2017-03-12 DIAGNOSIS — C7931 Secondary malignant neoplasm of brain: Secondary | ICD-10-CM | POA: Diagnosis not present

## 2017-03-12 DIAGNOSIS — C3490 Malignant neoplasm of unspecified part of unspecified bronchus or lung: Secondary | ICD-10-CM | POA: Diagnosis not present

## 2017-03-12 LAB — POCT I-STAT CREATININE: Creatinine, Ser: 0.7 mg/dL (ref 0.44–1.00)

## 2017-03-12 MED ORDER — GADOBENATE DIMEGLUMINE 529 MG/ML IV SOLN
10.0000 mL | Freq: Once | INTRAVENOUS | Status: AC | PRN
  Administered 2017-03-12: 9 mL via INTRAVENOUS

## 2017-03-12 NOTE — Telephone Encounter (Signed)
Call report of today's MRI brain with and without contrast from [facility] with [facility].   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 7 mm of leftward midline shift.  These results will be called to the ordering clinician or representative by the Radiologist Assistant, and communication documented in the PACS or zVision Dashboard.   Electronically Signed   By: Logan Bores M.D.   On: 03/12/2017 15:45  Next scheduled F/U 03-17-2017

## 2017-03-12 NOTE — Telephone Encounter (Signed)
John,  This lady has newly diagnosed metastatic lung cancer, her brain MRI showed numerous brian mets. I just booked your 9:30am slot for her, and I will see her before she sees you. Thanks much,  Truitt Merle MD

## 2017-03-12 NOTE — Telephone Encounter (Signed)
TCT pt's mobile #. Spoke with pt's husband, Albertina Parr. Informed him that Dr. Burr Medico wants Ms. Vanepps to come in tomorrow am to go over brain MRI results with both of them 2 9 am. He voiced understanding and they will be here.

## 2017-03-13 ENCOUNTER — Ambulatory Visit (HOSPITAL_BASED_OUTPATIENT_CLINIC_OR_DEPARTMENT_OTHER): Payer: Medicare Other | Admitting: Hematology

## 2017-03-13 ENCOUNTER — Encounter: Payer: Self-pay | Admitting: Radiation Oncology

## 2017-03-13 ENCOUNTER — Telehealth: Payer: Self-pay | Admitting: *Deleted

## 2017-03-13 ENCOUNTER — Ambulatory Visit
Admission: RE | Admit: 2017-03-13 | Discharge: 2017-03-13 | Disposition: A | Discharge status: Home / Self Care | Payer: Medicare Other | Source: Ambulatory Visit | Attending: Radiation Oncology | Admitting: Radiation Oncology

## 2017-03-13 ENCOUNTER — Encounter: Payer: Self-pay | Admitting: Hematology

## 2017-03-13 VITALS — BP 119/63 | HR 100 | Temp 98.2°F | Resp 18 | Ht 59.5 in | Wt 99.0 lb

## 2017-03-13 VITALS — BP 125/48 | HR 106 | Temp 98.5°F | Resp 17 | Ht 59.5 in | Wt 99.5 lb

## 2017-03-13 DIAGNOSIS — C7951 Secondary malignant neoplasm of bone: Secondary | ICD-10-CM | POA: Diagnosis not present

## 2017-03-13 DIAGNOSIS — Z7189 Other specified counseling: Secondary | ICD-10-CM | POA: Diagnosis not present

## 2017-03-13 DIAGNOSIS — R599 Enlarged lymph nodes, unspecified: Secondary | ICD-10-CM

## 2017-03-13 DIAGNOSIS — C7801 Secondary malignant neoplasm of right lung: Secondary | ICD-10-CM | POA: Diagnosis not present

## 2017-03-13 DIAGNOSIS — R634 Abnormal weight loss: Secondary | ICD-10-CM

## 2017-03-13 DIAGNOSIS — C3490 Malignant neoplasm of unspecified part of unspecified bronchus or lung: Secondary | ICD-10-CM

## 2017-03-13 DIAGNOSIS — R918 Other nonspecific abnormal finding of lung field: Secondary | ICD-10-CM

## 2017-03-13 DIAGNOSIS — R630 Anorexia: Secondary | ICD-10-CM

## 2017-03-13 DIAGNOSIS — E039 Hypothyroidism, unspecified: Secondary | ICD-10-CM | POA: Insufficient documentation

## 2017-03-13 DIAGNOSIS — C7931 Secondary malignant neoplasm of brain: Secondary | ICD-10-CM

## 2017-03-13 DIAGNOSIS — Z7982 Long term (current) use of aspirin: Secondary | ICD-10-CM | POA: Insufficient documentation

## 2017-03-13 DIAGNOSIS — Z510 Encounter for antineoplastic radiation therapy: Secondary | ICD-10-CM | POA: Diagnosis present

## 2017-03-13 DIAGNOSIS — C779 Secondary and unspecified malignant neoplasm of lymph node, unspecified: Secondary | ICD-10-CM

## 2017-03-13 DIAGNOSIS — Z87891 Personal history of nicotine dependence: Secondary | ICD-10-CM | POA: Diagnosis not present

## 2017-03-13 DIAGNOSIS — C7949 Secondary malignant neoplasm of other parts of nervous system: Principal | ICD-10-CM

## 2017-03-13 DIAGNOSIS — C3491 Malignant neoplasm of unspecified part of right bronchus or lung: Secondary | ICD-10-CM | POA: Diagnosis not present

## 2017-03-13 DIAGNOSIS — Z79899 Other long term (current) drug therapy: Secondary | ICD-10-CM | POA: Insufficient documentation

## 2017-03-13 DIAGNOSIS — I10 Essential (primary) hypertension: Secondary | ICD-10-CM | POA: Insufficient documentation

## 2017-03-13 MED ORDER — DEXAMETHASONE 4 MG PO TABS: 4.0000 mg | ORAL_TABLET | Freq: Three times a day (TID) | ORAL | 0 refills | Status: DC

## 2017-03-13 NOTE — Telephone Encounter (Signed)
"  This is Penny Stewart, calling to cancel appointment for Monday.  We received a call for an appointment on Monday 8:45 am.  We were there today and instructed not to come in until later in the day.  We saw Dr. Burr Medico today and do not need to be seen on  Monday.  Confirmed with Dr. Burr Medico the Monday F/U is not needed.  She does not need to see patient until after radiation and a scheduling message has been sent by provider.  Patient notified.

## 2017-03-13 NOTE — Progress Notes (Signed)
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 Burr Medico appt seen 03/13/17,, and  chemoeducation 03/17/17, infusion on 03/19/17, Ernestene Kiel 03/17/17 appt   Dose of Decadron, if applicable: No  Recent neurologic symptoms, if any: NO  Seizures:NO  Headaches: NO  Nausea: NO  Dizziness/ataxia: NO  Difficulty with hand coordination: NO  Focal numbness/weakness: yes  Visual deficits/changes: no  Confusion/Memory deficits: yes    MRI 03/12/2017: FINDINGS: Brain: There is no acute infarct or extra-axial fluid collection. A chronic microhemorrhage is noted in the anterior left parietal lobe. Cerebral volume is normal. 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  There are numerous enhancing lesions in both cerebral hemispheres, estimated to be on the order of 40 total lesions. No infratentorial lesions are identified. The largest lesions include a solidly enhancing 1.7 x 1.4 cm mass in the anterior right temporal lobe and a partially cystic 1.6 x 1.6 cm mass at the anterior right insula. The largest left cerebral hemispheric lesion measures 1.1 x 0.9 cm in the parietal lobe. There is extensive vasogenic edema throughout both cerebral hemispheres, right worse than left. There is partial effacement of the right lateral ventricle with 7 mm of leftward midline shift. There is no ventriculomegaly.  Vascular: Major intracranial vascular flow voids are preserved.  Skull and upper cervical spine: Unremarkable bone marrow signal.  Sinuses/Orbits: Unremarkable orbits. Paranasal sinuses and mastoid air cells are clear.  Other: None.  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 7 mm of leftward  midline shift.  Painful bone metastases at present, if YVD:PBAQVOHC in legs shoulder, stated  SAFETY ISSUES: fatigue, loss 20 lbs , appetite low weakness   Prior radiation? NO  Pacemaker/ICD? No Is the patient on methotrexate? 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

## 2017-03-13 NOTE — Progress Notes (Signed)
  Radiation Oncology         ([phone]) [phone] ________________________________  Name: Penny Stewart MRN: [id number]  Date: 03/13/2017  DOB: Apr 26, 1946    Simulation and treatment planning note  DIAGNOSIS:     ICD-9-CM ICD-10-CM   1. Brain metastases ([place]) 198.3 C79.31      The patient presented for simulation for the patient's upcoming course of whole brain radiation treatment. The patient was placed in a supine position and a customized thermoplastic head cast was constructed to aid in patient immobilization during the treatment. This complex treatment device will be used on a daily basis. In this fashion a CT scan was obtained through the head and neck region and isocenter was placed near midline within the brain.  The patient will be planned to receive a course of whole brain radiation treatment to a dose of 30 gray in 10 fractions at 3 gray per fraction. To accomplish this, 2 customized blocks have been designed which corresponds to left and right whole brain radiation fields. These 2 complex treatment devices will be used on a daily basis during the course of radiation. A complex isodose plan is requested to insure that the target area is adequately covered in to facilitate optimization of the treatment plan. A forward planning technique will also be evaluated to determine if this approach significantly improves the plan.   ________________________________   Jodelle Gross, MD, PhD

## 2017-03-13 NOTE — Progress Notes (Signed)
[place]  Telephone:(336) [phone] Fax:(336) [phone]  Clinic Follow Up Note   Patient Care Team: Foye Spurling, MD as PCP - General (Internal Medicine) 03/13/2017  CHIEF COMPLAINTS:  Metastatic lung cancer    Metastatic lung cancer (metastasis from lung to other site), unspecified laterality ([place])   02/07/2017 Imaging    CT AP IMPRESSION: 1. Extensive retroperitoneal, left inguinal, right femoral, and mesenteric lymphadenopathy highly concerning for malignancy and which may reflect metastatic disease or lymphoma. No clear primary malignancy identified in the abdomen or pelvis. 2. Multiple splenic masses consistent with metastases. 3. Aortic atherosclerosis. These results will be called to the ordering clinician or representative by the Radiologist Assistant, and communication documented in the PACS or zVision Dashboard.      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 Microscopic Comment The lymph node is replaced by malignant cells. Immunohistochemistry reveals the cells are positive for cytokeratin 7 and TTF-1. p63 has weak non-specific staining. The cells are negative for cytokeratin 20, S100, GCDFP, ER, GATA3, cytokeratin 5/6, CD30, CD3, CD20, CD43, CD45, and ALK. The immunohistochemical profile is consistent with a primary lung adenocarcinoma. There is sufficient tissue for molecular testing if requested (block 1C). Correlation with radiologic data is recommended. Dr. Lyndon Code has reviewed the case. The case was called to Abigail Butts at Dr. Darrel Hoover office on 02/25/2017.       02/20/2017 Surgery    Excision deep axillary mass with Dr. Dalbert Batman      02/27/2017 Initial Diagnosis    Metastatic lung cancer (metastasis from lung to other site), unspecified laterality ([place])      02/27/2017 PET scan    IMPRESSION: 1. Hypermetabolic adenopathy in the chest, abdomen, and pelvis,  with several hypermetabolic pulmonary nodules ; a faintly rim hypermetabolic splenic mass; and a questionably hypermetabolic lesion in the right iliac bone. The right axillary lymph node biopsy performed last week had immunohistochemical profile consistent with primary lung adenocarcinoma, and accordingly metastatic lung cancer is favored. 2. Possible metastatic involvement of the brain, there is abnormal white matter hypo density in the right cerebral hemisphere along with potential effacement of the anterior horn of the right lateral ventricle. MRI brain with and without contrast is recommended.      03/12/2017 Imaging    MR Brain W WO Contrast IMPRESSION: Numerous metastases with extensive edema throughout both cerebral hemispheres. 7 mm of leftward midline shift.         HISTORY OF PRESENTING ILLNESS:  Penny Stewart is a 71 year old woman referred for evaluation of lymphadenopathy. She reports weight loss over the past one to 2 years. Her husband reports more weight loss recently. For the past 2 months she has noted increasing fatigue and  in general not feeling well. 3-4 weeks ago she noted a "hard spot" in the left groin. She saw Dr. Carlis Abbott and was referred for CT scans of the abdomen and pelvis. She was found to have extensive retroperitoneal, left inguinal, right femoral and mesenteric lymphadenopathy. Multiple splenic masses were noted. She was referred to the [facility] for further evaluation.  The patient has a 15 year smoking history of 2-3 cigarettes daily. She quit in 2015/16. She has a glass of wine 1-2 x a week.   CURRENT THERAPY: Pending WBRT followed by systemic therapy   INTERVAL HISTORY: Penny Stewart returns to the clinic today for follow up. She is joined today by her husband and  son.  The patient recently had a Brain MRI showing numerous metastases with extensive edema throughout both cerebral hemispheres, as well as 46m of leftward midline shift.  The  patient denies any new pain, headaches, vision changes. She reports her appetite is very low, and she does not eat much. She has lost 4 lbs in the last 4 weeks. The patient reports a good energy level, however her husband reports she has had difficulty completing tasks due to fatigue. The patient is mostly in bed or sitting when at home.   The patient gives verbal permission today to discuss her medical condition with family, including her son who is present for the appointment today. She verbally agrees that she would like to have assistance from her family members during this time.     MEDICAL HISTORY:  1. Hypothyroidism 2. Hypertension 3. Hypercholesterolemia   SURGICAL HISTORY: Past Surgical History:  Procedure Laterality Date  . MASS EXCISION Right 02/20/2017   Procedure: EXCISION DEEP AXILLARY MASS;  Surgeon: HFanny Skates MD;  Location[facility]  Service: General;  Laterality: Right;   SOCIAL HISTORY: Social History   Social History  . Marital status: Married    Spouse name: N/A  . Number of children: N/A  . Years of education: N/A   Occupational History  . Not on file.   Social History Main Topics  . Smoking status: Former Smoker    Packs/day: 15.00    Years: 0.10    Quit date: 10/28/2014  . Smokeless tobacco: Never Used  . Alcohol use 0.6 oz/week    1 Glasses of wine per week     Comment: 1-2 times a week   . Drug use: No  . Sexual activity: Not on file   Other Topics Concern  . Not on file   Social History Narrative  . No narrative on file   [place] She is married. She has one son in good health. She is retired from [employer] She quit smoking 2 years ago, less than 1 pack per day for greater than 25 years. Every "2 or so days" she either has a glass of wine or a shot of cognac.  FAMILY HISTORY: Family History  Problem Relation Age of Onset  . Cancer Mother 784      colon cancer   . Cancer Sister 556      breast cancer      Mother deceased age 5529with colon cancer. Father deceased age 6646with heart problems. 10 siblings, all deceased. One sister died with "bile duct cancer". She thinks another sister may have had cancer as well.  ALLERGIES:  No known drug allergies  MEDICATIONS:  Current Outpatient Prescriptions  Medication Sig Dispense Refill  . amLODipine (NORVASC) 10 MG tablet Take 10 mg by mouth daily at 12 noon.     .Marland Kitchenaspirin EC 81 MG tablet Take 81 mg by mouth daily.    .Marland Kitchenatorvastatin (LIPITOR) 20 MG tablet Take 20 mg by mouth at bedtime.     . B COMPLEX-C PO Place 2 drops under the tongue daily. B Complex liquid    . levothyroxine (SYNTHROID, LEVOTHROID) 75 MCG tablet Take 75 mcg by mouth daily before breakfast.    . mirtazapine (REMERON) 30 MG tablet TK 1 T PO QD  1  . dexamethasone (DECADRON) 4 MG tablet Take 1 tablet (4 mg total) by mouth 3 (three) times daily. 90 tablet 0  . HYDROcodone-acetaminophen (  NORCO) 5-325 MG tablet Take 1-2 tablets by mouth every 6 (six) hours as needed for moderate pain or severe pain. (Patient not taking: Reported on 02/27/2017) 30 tablet 0   No current facility-administered medications for this visit.     REVIEW OF SYSTEMS:   Constitutional: Denies fevers, chills or abnormal night sweats. (+) Decreased appetite. (+) Poor energy level. (+) 20 lb weight loss Eyes: No vision change. No diplopia. Ears, nose, mouth, throat, and face: Denies mucositis or sore throat Respiratory: Denies cough, dyspnea or wheezes Cardiovascular: Denies palpitation, chest discomfort or lower extremity swelling Gastrointestinal:  Occasional mild nausea with certain scents. No dysphagia. Intermittent mild constipation. No bloody or black stools. Skin: Denies abnormal skin rashes Lymphatics: "Hard spot" left groin. Neurological: Denies numbness, tingling in the extremities. GU: No hematuria or dysuria. Behavioral/Psych: (+) Husband has noted recent memory issues.  All other systems were  reviewed with the patient and are negative.  PHYSICAL EXAMINATION:  Vitals:   03/13/17 0916  BP: (!) 125/48  Pulse: (!) 106  Resp: 17  Temp: 98.5 F (36.9 C)   Filed Weights   03/13/17 0916  Weight: 99 lb 8 oz (45.1 kg)    GENERAL:alert, no distress and comfortable SKIN: Pale appearing. No rashes or significant lesions EYES: normal, conjunctiva are pink and non-injected, sclera clear OROPHARYNX:no exudate, no erythema and lips, buccal mucosa, and tongue normal  NECK: supple, thyroid normal size, non-tender, without nodularity LYMPH: Surgical scar in the right axilla after biopsy. 1.5 cm left axillary lymph node; 3 cm left inguinal lymph node; 2 cm right femoral lymph node Right axilla shows large incision which is healing well without sign of infection. LUNGS: clear to auscultation and percussion with normal breathing effort HEART: regular rate & rhythm and no murmurs and no lower extremity edema ABDOMEN:abdomen soft, non-tender and normal bowel sounds; no organomegaly. Musculoskeletal:no cyanosis of digits and no clubbing  PSYCH: alert & oriented x 3 with fluent speech NEURO: no focal motor/sensory deficits BREAST: No mass palpated in either breast  LABORATORY DATA:  CBC Latest Ref Rng & Units 02/18/2017 02/13/2017  WBC 4.0 - 10.5 K/uL 12.9(H) 13.4(H)  Hemoglobin 12.0 - 15.0 g/dL 12.0 12.3  Hematocrit 36.0 - 46.0 % 37.7 38.0  Platelets 150 - 400 K/uL 245 217   CMP Latest Ref Rng & Units 03/12/2017 02/18/2017 02/13/2017  Glucose 65 - 99 mg/dL - 87 109  BUN 6 - 20 mg/dL - 18 15.0  Creatinine 0.44 - 1.00 mg/dL 0.70 0.63 0.8  Sodium 135 - 145 mmol/L - 140 142  Potassium 3.5 - 5.1 mmol/L - 3.9 4.2  Chloride 101 - 111 mmol/L - 105 -  CO2 22 - 32 mmol/L - 25 28  Calcium 8.9 - 10.3 mg/dL - 9.5 9.8  Total Protein 6.5 - 8.1 g/dL - 7.2 7.6  Total Bilirubin 0.3 - 1.2 mg/dL - 0.4 0.34  Alkaline Phos 38 - 126 U/L - 111 136  AST 15 - 41 U/L - 22 20  ALT 14 - 54 U/L - 11(L) 10    PATHOLOGY Diagnosis 02/20/17 Lymph node for lymphoma, 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 Microscopic Comment The lymph node is replaced by malignant cells. Immunohistochemistry reveals the cells are positive for cytokeratin 7 and TTF-1. p63 has weak non-specific staining. The cells are negative for cytokeratin 20, S100, GCDFP, ER, GATA3, cytokeratin 5/6, CD30, CD3, CD20, CD43, CD45, and ALK. The immunohistochemical profile is consistent with a primary lung adenocarcinoma. There  is sufficient tissue for molecular testing if requested (block 1C). Correlation with radiologic data is recommended. Dr. Lyndon Code has reviewed the case. The case was called to Abigail Butts at Dr. Darrel Hoover office on 02/25/2017.     RADIOGRAPHIC STUDIES: I have personally reviewed the radiological images as listed and agreed with the findings in the report. Mr Penny Stewart Wo Contrast  Result Date: 03/12/2017 CLINICAL DATA:  Recently diagnosed metastatic lung cancer. Concern for intracranial metastatic disease on PET-CT. EXAM: MRI HEAD WITHOUT AND WITH CONTRAST TECHNIQUE: Multiplanar, multiecho pulse sequences of the brain and surrounding structures were obtained without and with intravenous contrast. CONTRAST:  71m MULTIHANCE GADOBENATE DIMEGLUMINE 529 MG/ML IV SOLN COMPARISON:  PET-CT 02/27/2017 FINDINGS: Brain: There is no acute infarct or extra-axial fluid collection. A chronic microhemorrhage is noted in the anterior left parietal lobe. Cerebral volume is normal. 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 There are numerous enhancing lesions in both cerebral hemispheres, estimated to be on the order of 40 total lesions. No infratentorial lesions are identified. The largest lesions include a solidly enhancing 1.7 x 1.4 cm mass in the anterior right temporal lobe and a partially cystic 1.6 x 1.6 cm mass at the anterior right insula. The largest left cerebral hemispheric lesion measures 1.1 x 0.9  cm in the parietal lobe. There is extensive vasogenic edema throughout both cerebral hemispheres, right worse than left. There is partial effacement of the right lateral ventricle with 7 mm of leftward midline shift. There is no ventriculomegaly. Vascular: Major intracranial vascular flow voids are preserved. Skull and upper cervical spine: Unremarkable bone marrow signal. Sinuses/Orbits: Unremarkable orbits. Paranasal sinuses and mastoid air cells are clear. Other: None. 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 7 mm of leftward midline shift. These results will be called to the ordering clinician or representative by the Radiologist Assistant, and communication documented in the PACS or zVision Dashboard. Electronically Signed   By: ALogan BoresM.D.   On: 03/12/2017 15:45   Nm Pet Image Initial (pi) Skull Base To Thigh  Result Date: 02/27/2017 CLINICAL DATA:  Initial treatment strategy for splenic masses with abdominal and pelvic adenopathy on CT scan. EXAM: NUCLEAR MEDICINE PET SKULL BASE TO THIGH TECHNIQUE: 5.0 mCi F-18 FDG was injected intravenously. Full-ring PET imaging was performed from the skull base to thigh after the radiotracer. CT data was obtained and used for attenuation correction and anatomic localization. FASTING BLOOD GLUCOSE:  Value: 126 mg/dl COMPARISON:  02/07/2017 FINDINGS: NECK The amount of intracranial white matter hypodensity appears excessive, especially in the right temporal lobe, which may have some slightly reduced activity. Also, there seems to be some potential effacement of the frontal horn of the right lateral ventricle on image 1/4. Metastatic involvement of the brain is suspected although not directly demonstrated. Indistinct lymph nodes in the supraclavicular fossa bilaterally, with a suspected 8 mm left supraclavicular lymph node with maximum SUV 3.2. CHEST Hypermetabolic anterior mediastinal, right paratracheal, subcarinal,  right hilar, right infrahilar, than left axillary adenopathy observed. Left axillary lymph node measures 1.5 cm in short axis on image 45/4 and has maximum SUV of 5.9. Indistinctly marginated right paratracheal lymph node has short axis diameter 1.6 cm on image 53/4 and a maximum SUV of 7.6. Postoperative findings from right axillary dissection noted with a small fluid collection in the right subpectoral region, and gas in the axilla. Centrilobular emphysema. Spiculated right upper lobe 1.9 by 1.3 cm pulmonary nodule on image 29/8, maximum SUV  4.5. 1.8 by 1.4 cm left lower lobe nodule on image 38/8 has lobular margins and a maximum SUV of 6.6. Background mediastinal blood pool activity 2.4. Coronary, aortic arch, and branch vessel atherosclerotic vascular disease. Small pericardial effusion. ABDOMEN/PELVIS The dominant 3.2 cm splenic mass has faint accentuated activity along its margins, maximum SUV 4.0. There is some mild heterogeneity of splenic activity. Background hepatic activity SUV 2.6. Hypermetabolic retroperitoneal and pelvic adenopathy is observed including the left periaortic, aortocaval, right inguinal, left inguinal, and left external iliac chains. The index upper abdominal left periaortic node measures 1.9 cm in short axis on image 96/4 and has maximum SUV 5.4. Index left inguinal lymph node measures 2.4 cm in short axis on image 157/4 and has maximum standard uptake value 6.5. There is also a mesenteric lymph node in the right central pelvis measuring 1.4 cm in short axis on image 138/4, maximum SUV 4.8. SKELETON Subtle focus of activity in the right iliac bone just above the acetabulum, maximum SUV 4.4. No other definite accentuated osseous activity, and this lesion is questionable. IMPRESSION: 1. Hypermetabolic adenopathy in the chest, abdomen, and pelvis, with several hypermetabolic pulmonary nodules ; a faintly rim hypermetabolic splenic mass; and a questionably hypermetabolic lesion in the right  iliac bone. The right axillary lymph node biopsy performed last week had immunohistochemical profile consistent with primary lung adenocarcinoma, and accordingly metastatic lung cancer is favored. 2. Possible metastatic involvement of the brain, there is abnormal white matter hypo density in the right cerebral hemisphere along with potential effacement of the anterior horn of the right lateral ventricle. MRI brain with and without contrast is recommended. Electronically Signed   By: Van Clines M.D.   On: 02/27/2017 12:05    ASSESSMENT & PLAN: Penny Stewart is a 71 y.o. female who presents with:  1. 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  -Her biopsy immunostain showed positive CK 7 and TTF-1 1, which is consistent with lung adenocarcinoma. - We previously reviewed the PET scan reports and images from this morning with the patient and her family. It showed hypermetabolic right lung lesion with significant right hilar and mediastinal adenopathy, diffuse nodal metastasis, single right pelvic bone metastasis, and a hypermetabolic left lung lesion likely metastatic -We previously discussed the natural history of lung cancer, an incurable nature of her disease. The goal of therapy is palliative.  - I have requested [facility] and PD-L1 testing to determine the best treatment options for the patient. The PD-L1 came back negative (0%), unfortunately she is not a candidate for first-line immunotherapy. The rest of the FO genomic testing results still pending - I reviewed the patient's recent MRI images with the patient and her family today. Scan showed numerous metastases with extensive edema throughout both cerebral hemispheres, as well as 60m of leftward midline shift. I discussed the nature of metastatic disease to the brain, and the importance of considering whle brain  radiation. - The patient has a consult appointment with Dr. MLisbeth Renshawin radiation oncology today. -I plan to offer systemic therapy after she completes whole brain radiation. However due to her poor performance status, she may not be a candidate for intensive cytotoxic chemotherapy. We also discussed palliative care alone, if her performance status drops further. She voiced good understanding. - I stressed the importance of having support from the patient's family. The patient will need assistance from many  family members, and I encouraged her family present today to take this very seriously.  2. 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 Lisbeth Renshaw today, after my visit.  -I have reviewed the case with Dr. Lisbeth Renshaw, he will offer whole brain radiation to be started soon. -I will defer steroids to Dr. Lisbeth Renshaw   3. Anorexia/Weight Loss - The patient has lost approximately 20 lbs in the last several months. - She has previously reported a decreased appetite. - The patient is currently taking Remeron 15 mg once daily.  -I recommend her to increase this to 30 mg, or 2 tablets daily, to increase her appetite. -dietician referral  - The patient has now lost 4 lbs since her last encounter in this clinic; she agrees to drink 2 bottles of Ensure or similar nutritional supplement daily to maintain her weight.  4. Goal of care discussion  -We discussed the incurable nature of her cancer, and the overall poor prognosis, especially if she does not have good response to chemotherapy or progress on chemo -The patient understands the goal of care is palliative. -I recommend DNR/DNI, she will think about it   5. Family dynamics -The patient's sone Karle Starch called me early this morning before her visit, voiced his concern of that her husband (Sheldon's step father) does not allow other family members to be involved much in her care -we reviewed this in today's visit, due to her  metastatic disease and poor performance status, she is likely going to require a lot of care at home. I encourage her family members, especially her children, to be involved in her care. She agrees. -Her son Florian Buff presented during her visit today.   PLAN: - Consultation with Dr. Lisbeth Renshaw today for WBRT - Patient will maintain weight with nutritional supplement drinks twice daily. - Follow up in the last week of RT to discuss systemic therapy  -I will follow up FO result, to see if she is a candidate for targeted therapy    No orders of the defined types were placed in this encounter.  The patient was encouraged to call or return to the clinic sooner with any questions or concerns.  I spent a total of 40 minutes for this visit, wasn't 50% face-to-face counseling.  This document serves as a record of services personally performed by Truitt Merle, MD. It was created on her behalf by Maryla Morrow, a trained medical scribe. The creation of this record is based on the scribe's personal observations and the provider's statements to them. This document has been checked and approved by the attending provider.    Truitt Merle, MD 03/13/2017

## 2017-03-13 NOTE — Telephone Encounter (Signed)
FYI "My mom has an appointment at 9:30 today with Dr. Burr Medico.  I need to speak with Dr. Burr Medico for five minutes before she sees my mom today OR HAVE AT [facility].  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  There are layers in mom's situation that are deeper than they appear.  My step-father is trying to tackle her care alone.  He is physically able but has never experienced anything of this nature before.  Mentally, emotionally and physically he needs help but doesn't want help to come or allow assistance at this time but continually crying out (emotionally) for help.  His mindset is this is his responsibility, duty and has isolated all family except for me.  My step-father's son came last week, tried to talk with his dad but dad was not rational.  If he hears anything he views as contradictory to what he thinks or doesn't understand the concept, he'll shut down.  His own children can't talk rationally with him.   UNLESS HE HEARS FROM DR. FENG TO ALLOW HELP AND ASSISTANCE FROM FAMILY, HE WILL NOT ALLOW ANYONE ELSE TO HELP.   I am my mom's only child.  She has eleven brothers and sisters with multiple nieces and nephews she's always supported in their need.  They want to support her and are available to help but my step-father has isolated her from the family.  Mom has tried doing different things to shield or protect me because she's my mom.  Hold on please, I have a call from my mom.  Are family members able to be in the room for today's appointments?  I notified my mom I'm getting dressed and will meet her at the hospital."  Yes, family are allowed to be present during appointments.  Informed Karle Starch of [facility] Social Work available if needed for family.  Notified Dr. Burr Medico of these family dynamics shared Triage with eighteen minute call.  Also notifying radiation.

## 2017-03-13 NOTE — Progress Notes (Signed)
Radiation Oncology         [phone] ________________________________  Name: Penny Stewart MRN: [id number]  Date: 03/13/2017  DOB: 1945/11/15  YY:TKPTW, Don Broach, MD  Truitt Merle, MD     REFERRING PHYSICIAN: Truitt Merle, MD   DIAGNOSIS: The primary encounter diagnosis was Brain metastases [facility]). A diagnosis of Metastatic lung cancer (metastasis from lung to other site), unspecified laterality [facility]) was also pertinent to this visit.   HISTORY OF PRESENT ILLNESS: MAROLYN Stewart is a 71 y.o. female seen at the request of Dr. Burr Medico for a new diagnosis of metastatic lung cancer. The patient was found to have palpable groin adenopathy on the left and her PCP sent her for CT abdomen/pelvis on 02/07/17. She had also noticed about 20 pounds of weight loss. Her CT revealed multiple splenic masses up to 3.1 cm, and diffuse retroperitoneal adenaopathy measuring up to 1.7 cm, left ilial nodes measuring up to 1.6 cm, a left inguinal node measuring up to 2.1 cm, and two pelvic mesentery nodes seen up to 1.6 cm. She also had a right axillary node palpable on exam and was seen by general surgery. On 02/20/17 this was excised surgically and found to have metastatic high grade carcinoma consistent with a lung adenocarcinoma. A PET scan on 02/27/17 revealed hypermetabolic adenopathy in the chest, abdomen,a nd pelvis with hypermetabolic pulmonary nodules, metastatic splenic mass, and hypermetabolism in the right iliac bone. Possible brain enhancement was noted along the right horn of the lateral ventricle on the right. She met with Dr. Burr Medico who's recommended chemotherapy with carboplatin and alimta. She has undergone brain MRI on 03/12/17 which revealed multiple brain metastases estimated to a total of at least 40 lesions, the largest was about 1.7 cm x 1.4 cm in the anterior right temporal lobe and partially cystic. A 1.6 x 1.6 cm mass in the anterior right insula was seen, and on the a left the largest lesion was 1.1 x  .9 cm in the left parietal lobe. Extensive vasogenic edema was seen right greater than left with partial effacement of the right lateral ventricle and 7 mm of leftward midline shift. She was counseled on meeting with Dr. Lisbeth Renshaw and comes today to pursue radiotherapy to the brain.     PREVIOUS RADIATION THERAPY: No   PAST MEDICAL HISTORY:  Past Medical History:  Diagnosis Date  . Adenopathy 02/07/2017  . Hypertension   . Hypothyroidism        PAST SURGICAL HISTORY: Past Surgical History:  Procedure Laterality Date  . MASS EXCISION Right 02/20/2017   Procedure: EXCISION DEEP AXILLARY MASS;  Surgeon: Fanny Skates, MD;  Location: [facility];  Service: General;  Laterality: Right;     FAMILY HISTORY:  Family History  Problem Relation Age of Onset  . Cancer Mother 63       colon cancer   . Cancer Sister 62       breast cancer      SOCIAL HISTORY:  reports that she quit smoking about 2 years ago. She has a 1.50 pack-year smoking history. She has never used smokeless tobacco. She reports that she drinks about 0.6 oz of alcohol per week . She reports that she does not use drugs. The patient is married and lives in [place]. She is retired, and worked at [employer] and led the [employer] for 35 years.   ALLERGIES: No known allergies   MEDICATIONS:  Current Outpatient Prescriptions  Medication Sig Dispense Refill  .  amLODipine (NORVASC) 10 MG tablet Take 10 mg by mouth daily at 12 noon.     [name] aspirin EC 81 MG tablet Take 81 mg by mouth daily.    [name] atorvastatin (LIPITOR) 20 MG tablet Take 20 mg by mouth at bedtime.     . B COMPLEX-C PO Place 2 drops under the tongue daily. B Complex liquid    . levothyroxine (SYNTHROID, LEVOTHROID) 75 MCG tablet Take 75 mcg by mouth daily before breakfast.    . mirtazapine (REMERON) 30 MG tablet TK 1 T PO QD  1  . dexamethasone (DECADRON) 4 MG tablet Take 1 tablet (4 mg total) by mouth 3 (three) times daily. 90 tablet 0  .  HYDROcodone-acetaminophen (NORCO) 5-325 MG tablet Take 1-2 tablets by mouth every 6 (six) hours as needed for moderate pain or severe pain. (Patient not taking: Reported on 02/27/2017) 30 tablet 0   No current facility-administered medications for this encounter.      REVIEW OF SYSTEMS: On review of systems, the patient reports that she is doing well overall. But her family has noticed some confusion in the last few weeks. She denies any chest pain, shortness of breath, cough, fevers, chills, night sweats. She has lost about 20 pounds in the last few months. She has shortness of breath on exertion noted by her family. She denies any bowel or bladder disturbances, and denies abdominal pain, nausea or vomiting. She denies any new musculoskeletal or joint aches or pains. A complete review of systems is obtained and is otherwise negative.     PHYSICAL EXAM:  Wt Readings from Last 3 Encounters:  03/13/17 99 lb (44.9 kg)  03/13/17 99 lb 8 oz (45.1 kg)  02/27/17 103 lb 8 oz (46.9 kg)   Temp Readings from Last 3 Encounters:  03/13/17 98.2 F (36.8 C) (Oral)  03/13/17 98.5 F (36.9 C) (Oral)  02/27/17 98.7 F (37.1 C) (Oral)   BP Readings from Last 3 Encounters:  03/13/17 119/63  03/13/17 (!) 125/48  02/27/17 119/60   Pulse Readings from Last 3 Encounters:  03/13/17 100  03/13/17 (!) 106  02/27/17 94   Pain Assessment Pain Score: 0-No pain/10  In general this is a well appearing caucasian female in no acute distress. She is alert and oriented x4 and appropriate throughout the examination. HEENT reveals that the patient is normocephalic, atraumatic. EOMs are intact. PERRLA. Skin is intact without any evidence of gross lesions. Cardiovascular exam reveals a regular rate and rhythm, no clicks rubs or murmurs are auscultated. Chest is clear to auscultation bilaterally. Lymphatic assessment is performed and does not reveal any adenopathy in the cervical, supraclavicular, axillary, or inguinal  chains. Abdomen has active bowel sounds in all quadrants and is intact. The abdomen is soft, non tender, non distended. Lower extremities are negative for pretibial pitting edema, deep calf tenderness, cyanosis or clubbing.   ECOG = 1  0 - Asymptomatic (Fully active, able to carry on all predisease activities without restriction)  1 - Symptomatic but completely ambulatory (Restricted in physically strenuous activity but ambulatory and able to carry out work of a light or sedentary nature. For example, light housework, office work)  2 - Symptomatic, <50% in bed during the day (Ambulatory and capable of all self care but unable to carry out any work activities. Up and about more than 50% of waking hours)  3 - Symptomatic, >50% in bed, but not bedbound (Capable of only limited self-care, confined to bed or chair 50%  or more of waking hours)  4 - Bedbound (Completely disabled. Cannot carry on any self-care. Totally confined to bed or chair)  5 - Death   Eustace Pen MM, Creech RH, Tormey DC, et al. [phone]). "Toxicity and response criteria of the [facility]". [place] Oncol. 5 (6): 649-55    LABORATORY DATA:  Lab Results  Component Value Date   WBC 12.9 (H) 02/18/2017   HGB 12.0 02/18/2017   HCT 37.7 02/18/2017   MCV 82.5 02/18/2017   PLT 245 02/18/2017   Lab Results  Component Value Date   NA 140 02/18/2017   K 3.9 02/18/2017   CL 105 02/18/2017   CO2 25 02/18/2017   Lab Results  Component Value Date   ALT 11 (L) 02/18/2017   AST 22 02/18/2017   ALKPHOS 111 02/18/2017   BILITOT 0.4 02/18/2017      RADIOGRAPHY: Mr Jeri Cos UD Contrast  Result Date: 03/12/2017 CLINICAL DATA:  Recently diagnosed metastatic lung cancer. Concern for intracranial metastatic disease on PET-CT. EXAM: MRI HEAD WITHOUT AND WITH CONTRAST TECHNIQUE: Multiplanar, multiecho pulse sequences of the brain and surrounding structures were obtained without and with intravenous contrast.  CONTRAST:  61m MULTIHANCE GADOBENATE DIMEGLUMINE 529 MG/ML IV SOLN COMPARISON:  PET-CT 02/27/2017 FINDINGS: Brain: There is no acute infarct or extra-axial fluid collection. A chronic microhemorrhage is noted in the anterior left parietal lobe. Cerebral volume is normal. 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 There are numerous enhancing lesions in both cerebral hemispheres, estimated to be on the order of 40 total lesions. No infratentorial lesions are identified. The largest lesions include a solidly enhancing 1.7 x 1.4 cm mass in the anterior right temporal lobe and a partially cystic 1.6 x 1.6 cm mass at the anterior right insula. The largest left cerebral hemispheric lesion measures 1.1 x 0.9 cm in the parietal lobe. There is extensive vasogenic edema throughout both cerebral hemispheres, right worse than left. There is partial effacement of the right lateral ventricle with 7 mm of leftward midline shift. There is no ventriculomegaly. Vascular: Major intracranial vascular flow voids are preserved. Skull and upper cervical spine: Unremarkable bone marrow signal. Sinuses/Orbits: Unremarkable orbits. Paranasal sinuses and mastoid air cells are clear. Other: None. 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 7 mm of leftward midline shift. These results will be called to the ordering clinician or representative by the Radiologist Assistant, and communication documented in the PACS or zVision Dashboard. Electronically Signed   By: ALogan BoresM.D.   On: 03/12/2017 15:45   Nm Pet Image Initial (pi) Skull Base To Thigh  Result Date: 02/27/2017 CLINICAL DATA:  Initial treatment strategy for splenic masses with abdominal and pelvic adenopathy on CT scan. EXAM: NUCLEAR MEDICINE PET SKULL BASE TO THIGH TECHNIQUE: 5.0 mCi F-18 FDG was injected intravenously. Full-ring PET imaging was performed from the skull base to thigh after the radiotracer. CT data  was obtained and used for attenuation correction and anatomic localization. FASTING BLOOD GLUCOSE:  Value: 126 mg/dl COMPARISON:  02/07/2017 FINDINGS: NECK The amount of intracranial white matter hypodensity appears excessive, especially in the right temporal lobe, which may have some slightly reduced activity. Also, there seems to be some potential effacement of the frontal horn of the right lateral ventricle on image 1/4. Metastatic involvement of the brain is suspected although not directly demonstrated. Indistinct lymph nodes in the supraclavicular fossa bilaterally, with a suspected 8 mm left supraclavicular lymph node with  maximum SUV 3.2. CHEST Hypermetabolic anterior mediastinal, right paratracheal, subcarinal, right hilar, right infrahilar, than left axillary adenopathy observed. Left axillary lymph node measures 1.5 cm in short axis on image 45/4 and has maximum SUV of 5.9. Indistinctly marginated right paratracheal lymph node has short axis diameter 1.6 cm on image 53/4 and a maximum SUV of 7.6. Postoperative findings from right axillary dissection noted with a small fluid collection in the right subpectoral region, and gas in the axilla. Centrilobular emphysema. Spiculated right upper lobe 1.9 by 1.3 cm pulmonary nodule on image 29/8, maximum SUV 4.5. 1.8 by 1.4 cm left lower lobe nodule on image 38/8 has lobular margins and a maximum SUV of 6.6. Background mediastinal blood pool activity 2.4. Coronary, aortic arch, and branch vessel atherosclerotic vascular disease. Small pericardial effusion. ABDOMEN/PELVIS The dominant 3.2 cm splenic mass has faint accentuated activity along its margins, maximum SUV 4.0. There is some mild heterogeneity of splenic activity. Background hepatic activity SUV 2.6. Hypermetabolic retroperitoneal and pelvic adenopathy is observed including the left periaortic, aortocaval, right inguinal, left inguinal, and left external iliac chains. The index upper abdominal left  periaortic node measures 1.9 cm in short axis on image 96/4 and has maximum SUV 5.4. Index left inguinal lymph node measures 2.4 cm in short axis on image 157/4 and has maximum standard uptake value 6.5. There is also a mesenteric lymph node in the right central pelvis measuring 1.4 cm in short axis on image 138/4, maximum SUV 4.8. SKELETON Subtle focus of activity in the right iliac bone just above the acetabulum, maximum SUV 4.4. No other definite accentuated osseous activity, and this lesion is questionable. IMPRESSION: 1. Hypermetabolic adenopathy in the chest, abdomen, and pelvis, with several hypermetabolic pulmonary nodules ; a faintly rim hypermetabolic splenic mass; and a questionably hypermetabolic lesion in the right iliac bone. The right axillary lymph node biopsy performed last week had immunohistochemical profile consistent with primary lung adenocarcinoma, and accordingly metastatic lung cancer is favored. 2. Possible metastatic involvement of the brain, there is abnormal white matter hypo density in the right cerebral hemisphere along with potential effacement of the anterior horn of the right lateral ventricle. MRI brain with and without contrast is recommended. Electronically Signed   By: Van Clines M.D.   On: 02/27/2017 12:05       IMPRESSION/PLAN: 1. Stage IV, [id number] NSCLC, adenocarcinoma of either right upper lobe versus left lower lobe with disease to the retroperitoneal, axillary, and inguinal nodes, right iliac bone, and brain. Dr. Lisbeth Renshaw discusses the findings from her pathology, and imaging studies. We discussed the concerns in the brain with the midline shift, as well as edema seen and would recommend starting Dexamethasone 4 mg TID. I have written a prescription and sent this to her pharmacy. We will anticipate tapering this once she completes treatment. We discussed options of radiotherapy and she is currently a candidate for whole brain radiation.  We discussed the risks,  benefits, short, and long term effects of radiotherapy, and the patient is interested in proceeding. Dr. Lisbeth Renshaw discusses the delivery and logistics of radiotherapy and would recommend a course of 30 Gy in 10 fractions. She will also begin chemotherapy as well under the care with Dr. Burr Medico following radiotherapy. She is in agreement. Written consent is obtained and placed in the chart, a copy provided to the patient. Simulation will be performed today.  The above documentation reflects my direct findings during this shared patient visit. Please see the separate note by Dr. Lisbeth Renshaw  on this date for the remainder of the patient's plan of care.    Carola Rhine, PAC

## 2017-03-13 NOTE — Progress Notes (Signed)
Please see the Nurse Progress Note in the MD Initial Consult Encounter for this patient. 

## 2017-03-14 ENCOUNTER — Telehealth: Payer: Self-pay | Admitting: *Deleted

## 2017-03-14 DIAGNOSIS — Z510 Encounter for antineoplastic radiation therapy: Secondary | ICD-10-CM | POA: Diagnosis not present

## 2017-03-14 NOTE — Telephone Encounter (Signed)
Spoke with the patient first she stated she was fine, then spoke with the husband, he ws worried that the steroid that she started was making her too hyper', informed him that she should continue taking her steroid for inflamatio to her brain, and that deacdron can make her jittery and increase appetite also, he also called her other Dr. And was told similar, thanked Rn for returning his call 9:51 AM

## 2017-03-17 ENCOUNTER — Encounter (HOSPITAL_COMMUNITY): Payer: Self-pay

## 2017-03-17 ENCOUNTER — Ambulatory Visit
Admission: RE | Admit: 2017-03-17 | Discharge: 2017-03-17 | Disposition: A | Discharge status: Home / Self Care | Payer: Medicare Other | Source: Ambulatory Visit | Attending: Radiation Oncology | Admitting: Radiation Oncology

## 2017-03-17 ENCOUNTER — Encounter: Payer: Self-pay | Admitting: *Deleted

## 2017-03-17 ENCOUNTER — Ambulatory Visit: Payer: Medicare Other

## 2017-03-17 ENCOUNTER — Ambulatory Visit: Payer: Medicare Other | Admitting: Nutrition

## 2017-03-17 ENCOUNTER — Ambulatory Visit: Payer: Medicare Other | Admitting: Hematology

## 2017-03-17 ENCOUNTER — Other Ambulatory Visit: Payer: Medicare Other

## 2017-03-17 DIAGNOSIS — Z510 Encounter for antineoplastic radiation therapy: Secondary | ICD-10-CM | POA: Diagnosis not present

## 2017-03-17 DIAGNOSIS — C3490 Malignant neoplasm of unspecified part of unspecified bronchus or lung: Secondary | ICD-10-CM

## 2017-03-17 MED ORDER — FOLIC ACID 1 MG PO TABS: 1.0000 mg | ORAL_TABLET | Freq: Every day | ORAL | 2 refills | Status: DC

## 2017-03-17 NOTE — Progress Notes (Signed)
71 year old female diagnosed with metastatic lung cancer.  She has a patient of Dr. Burr Medico.    Past medical history includes hypothyroidism, hypertension, and tobacco usage.  Medications include Lipitor, B complex vitamins, Synthroid, and Remeron.  Labs include albumin 4.1 on April 24.  Height: 4 feet 11/2 inches. Weight: 102.2 pounds. Usual body weight: 110 pounds per patient. BMI: 20.3.  Patient endorses weight loss over several years of about 20 pounds. She reports increased fatigue. Patient reports appetite is "okay".  Dietary recall reveals patient consuming approximately 745 cal and 25 g protein in a 24-hour period.   Patient states she has lactose intolerance. Reports she will try to drink oral nutrition supplements.  Nutrition diagnosis: Inadequate oral intake related to metastatic lung cancer as evidenced by 8 lb wt loss and estimated insufficient intake of energy and protein from diet when compared to requirements.  Intervention: Educated patient to increase calories and protein in 6 small meals/snacks daily. Recommended patient try to drink to oral nutrition supplements daily and provided samples. Educated patient on high-calorie, high-protein foods and provided fact sheet. Encouraged bowel regimen and provided fact sheet on constipation. Recommended increased fluids. Teach back method used.  Questions were answered.  Contact information provided.  Monitoring, evaluation, goals: Patient will work to increase calories and protein to minimize further weight loss.  Next visit: To be scheduled as needed.  **Disclaimer: This note was dictated with voice recognition software. Similar sounding words can inadvertently be transcribed and this note may contain transcription errors which may not have been corrected upon publication of note.**

## 2017-03-17 NOTE — Progress Notes (Signed)
[place] Work  Holiday representative received referral from Therapist, sports for support and information on assistance at home.  CSW met with patient, patients husband, and son in [place] office at [facility].  CSW informed patient and family on the support team and support services available.  CSW, patient and family discussed home health and home care.  Patient stated she felt comfortable with her ADL's and care at home at this point, but was open to exploring resources and options to prepare for the future.  Patient and family expressed interest in a home health assessment and were agreeable to a referral.  CSW shared patients request with RN.  CSW provided contact information and encouraged patient and family to call with additional needs or concerns.    Johnnye Lana, MSW, LCSW, OSW-C Clinical Social Worker [facility] [phone]

## 2017-03-18 ENCOUNTER — Other Ambulatory Visit: Payer: Self-pay | Admitting: *Deleted

## 2017-03-18 ENCOUNTER — Ambulatory Visit
Admission: RE | Admit: 2017-03-18 | Discharge: 2017-03-18 | Disposition: A | Discharge status: Home / Self Care | Payer: Medicare Other | Source: Ambulatory Visit | Attending: Radiation Oncology | Admitting: Radiation Oncology

## 2017-03-18 DIAGNOSIS — Z510 Encounter for antineoplastic radiation therapy: Secondary | ICD-10-CM | POA: Diagnosis not present

## 2017-03-19 ENCOUNTER — Telehealth: Payer: Self-pay | Admitting: Hematology

## 2017-03-19 ENCOUNTER — Ambulatory Visit
Admission: RE | Admit: 2017-03-19 | Discharge: 2017-03-19 | Disposition: A | Discharge status: Home / Self Care | Payer: Medicare Other | Source: Ambulatory Visit | Attending: Radiation Oncology | Admitting: Radiation Oncology

## 2017-03-19 ENCOUNTER — Ambulatory Visit: Payer: Medicare Other

## 2017-03-19 DIAGNOSIS — Z510 Encounter for antineoplastic radiation therapy: Secondary | ICD-10-CM | POA: Diagnosis not present

## 2017-03-19 NOTE — Telephone Encounter (Signed)
No los per 03/13/17 visit. °

## 2017-03-20 ENCOUNTER — Ambulatory Visit
Admission: RE | Admit: 2017-03-20 | Discharge: 2017-03-20 | Disposition: A | Discharge status: Home / Self Care | Payer: Medicare Other | Source: Ambulatory Visit | Attending: Radiation Oncology | Admitting: Radiation Oncology

## 2017-03-20 ENCOUNTER — Encounter: Payer: Self-pay | Admitting: *Deleted

## 2017-03-20 DIAGNOSIS — Z510 Encounter for antineoplastic radiation therapy: Secondary | ICD-10-CM | POA: Diagnosis not present

## 2017-03-20 NOTE — Progress Notes (Signed)
[place] Psychosocial Distress Screening Clinical Social Work  Clinical Social Work was referred by distress screening protocol.  The patient scored a 10 on the Psychosocial Distress Thermometer which indicates severe distress. Clinical Social Worker met with Penny Stewart and family to assess for distress and other psychosocial needs.   ONCBCN DISTRESS SCREENING 03/13/2017  Screening Type Initial Screening  Distress experienced in past week (1-10) 10  Emotional problem type Adjusting to illness  Information Concerns Type Lack of info about diagnosis;Lack of info about treatment;Lack of info about complementary therapy choices  Physical Problem type Loss of appetitie  Physician notified of physical symptoms Yes  Referral to clinical psychology Yes;No  Referral to clinical social work Yes  Referral to dietition Yes   Penny Stewart was seen by CSW, Johnnye Lana on 5/22 and had needs addressed. See below.               [place] Work  Holiday representative received referral from Therapist, sports for support and information on assistance at home.  CSW met with patient, patients husband, and son in [place] office at [facility].  CSW informed patient and family on the support team and support services available.  CSW, patient and family discussed home health and home care.  Patient stated she felt comfortable with her ADL's and care at home at this point, but was open to exploring resources and options to prepare for the future.  Patient and family expressed interest in a home health assessment and were agreeable to a referral.  CSW shared patients request with RN.  CSW provided contact information and encouraged patient and family to call with additional needs or concerns.    Johnnye Lana, MSW, LCSW, OSW-C Clinical Social Worker [facility] [phone]                                                             Clinical Social Worker follow up needed: No.  If yes, follow up plan: Loren Racer, Marlinda Mike,  OSW-C Clinical Social Worker [place]  [facility] Phone: [phone] Fax: [phone]

## 2017-03-21 ENCOUNTER — Ambulatory Visit
Admission: RE | Admit: 2017-03-21 | Discharge: 2017-03-21 | Disposition: A | Discharge status: Home / Self Care | Payer: Medicare Other | Source: Ambulatory Visit | Attending: Radiation Oncology | Admitting: Radiation Oncology

## 2017-03-21 DIAGNOSIS — Z510 Encounter for antineoplastic radiation therapy: Secondary | ICD-10-CM | POA: Diagnosis not present

## 2017-03-25 ENCOUNTER — Ambulatory Visit
Admission: RE | Admit: 2017-03-25 | Discharge: 2017-03-25 | Disposition: A | Discharge status: Home / Self Care | Payer: Medicare Other | Source: Ambulatory Visit | Attending: Radiation Oncology | Admitting: Radiation Oncology

## 2017-03-25 DIAGNOSIS — Z510 Encounter for antineoplastic radiation therapy: Secondary | ICD-10-CM | POA: Diagnosis not present

## 2017-03-26 ENCOUNTER — Ambulatory Visit
Admission: RE | Admit: 2017-03-26 | Discharge: 2017-03-26 | Disposition: A | Discharge status: Home / Self Care | Payer: Medicare Other | Source: Ambulatory Visit | Attending: Radiation Oncology | Admitting: Radiation Oncology

## 2017-03-26 DIAGNOSIS — Z510 Encounter for antineoplastic radiation therapy: Secondary | ICD-10-CM | POA: Diagnosis not present

## 2017-03-27 ENCOUNTER — Ambulatory Visit
Admission: RE | Admit: 2017-03-27 | Discharge: 2017-03-27 | Disposition: A | Discharge status: Home / Self Care | Payer: Medicare Other | Source: Ambulatory Visit | Attending: Radiation Oncology | Admitting: Radiation Oncology

## 2017-03-27 DIAGNOSIS — Z510 Encounter for antineoplastic radiation therapy: Secondary | ICD-10-CM | POA: Diagnosis not present

## 2017-03-28 ENCOUNTER — Ambulatory Visit
Admission: RE | Admit: 2017-03-28 | Discharge: 2017-03-28 | Disposition: A | Discharge status: Home / Self Care | Payer: Medicare Other | Source: Ambulatory Visit | Attending: Radiation Oncology | Admitting: Radiation Oncology

## 2017-03-28 ENCOUNTER — Ambulatory Visit (HOSPITAL_BASED_OUTPATIENT_CLINIC_OR_DEPARTMENT_OTHER): Payer: Medicare Other | Admitting: Hematology

## 2017-03-28 ENCOUNTER — Ambulatory Visit (HOSPITAL_BASED_OUTPATIENT_CLINIC_OR_DEPARTMENT_OTHER): Payer: Medicare Other

## 2017-03-28 ENCOUNTER — Telehealth: Payer: Self-pay | Admitting: Hematology

## 2017-03-28 VITALS — BP 153/73 | HR 98 | Temp 98.4°F | Resp 20 | Ht 59.5 in | Wt 100.8 lb

## 2017-03-28 DIAGNOSIS — Z7189 Other specified counseling: Secondary | ICD-10-CM | POA: Diagnosis not present

## 2017-03-28 DIAGNOSIS — C7951 Secondary malignant neoplasm of bone: Secondary | ICD-10-CM

## 2017-03-28 DIAGNOSIS — C7931 Secondary malignant neoplasm of brain: Secondary | ICD-10-CM | POA: Diagnosis not present

## 2017-03-28 DIAGNOSIS — C3490 Malignant neoplasm of unspecified part of unspecified bronchus or lung: Secondary | ICD-10-CM

## 2017-03-28 DIAGNOSIS — C779 Secondary and unspecified malignant neoplasm of lymph node, unspecified: Secondary | ICD-10-CM

## 2017-03-28 DIAGNOSIS — Z510 Encounter for antineoplastic radiation therapy: Secondary | ICD-10-CM | POA: Diagnosis not present

## 2017-03-28 DIAGNOSIS — R630 Anorexia: Secondary | ICD-10-CM

## 2017-03-28 DIAGNOSIS — R918 Other nonspecific abnormal finding of lung field: Secondary | ICD-10-CM

## 2017-03-28 DIAGNOSIS — R634 Abnormal weight loss: Secondary | ICD-10-CM | POA: Diagnosis not present

## 2017-03-28 LAB — CBC WITH DIFFERENTIAL/PLATELET
BASO%: 0.1 % (ref 0.0–2.0)
Basophils Absolute: 0 10*3/uL (ref 0.0–0.1)
EOS%: 0 % (ref 0.0–7.0)
Eosinophils Absolute: 0 10*3/uL (ref 0.0–0.5)
HCT: 38.2 % (ref 34.8–46.6)
HGB: 12.4 g/dL (ref 11.6–15.9)
LYMPH%: 1.5 % — AB (ref 14.0–49.7)
MCH: 26.2 pg (ref 25.1–34.0)
MCHC: 32.5 g/dL (ref 31.5–36.0)
MCV: 80.6 fL (ref 79.5–101.0)
MONO#: 0.7 10*3/uL (ref 0.1–0.9)
MONO%: 2.1 % (ref 0.0–14.0)
NEUT#: 32 10*3/uL — ABNORMAL HIGH (ref 1.5–6.5)
NEUT%: 96.3 % — ABNORMAL HIGH (ref 38.4–76.8)
Platelets: 226 10*3/uL (ref 145–400)
RBC: 4.74 10*6/uL (ref 3.70–5.45)
RDW: 14.5 % (ref 11.2–14.5)
WBC: 33.2 10*3/uL — AB (ref 3.9–10.3)
lymph#: 0.5 10*3/uL — ABNORMAL LOW (ref 0.9–3.3)

## 2017-03-28 LAB — COMPREHENSIVE METABOLIC PANEL
ALT: 59 U/L — AB (ref 0–55)
AST: 31 U/L (ref 5–34)
Albumin: 3.6 g/dL (ref 3.5–5.0)
Alkaline Phosphatase: 127 U/L (ref 40–150)
Anion Gap: 8 mEq/L (ref 3–11)
BILIRUBIN TOTAL: 0.39 mg/dL (ref 0.20–1.20)
BUN: 22.5 mg/dL (ref 7.0–26.0)
CO2: 28 meq/L (ref 22–29)
CREATININE: 0.7 mg/dL (ref 0.6–1.1)
Calcium: 9.4 mg/dL (ref 8.4–10.4)
Chloride: 103 mEq/L (ref 98–109)
EGFR: >90 mL/min/{1.73_m2} (ref >90)
GLUCOSE: 129 mg/dL (ref 70–140)
Potassium: 4.7 mEq/L (ref 3.5–5.1)
SODIUM: 139 meq/L (ref 136–145)
Total Protein: 6.6 g/dL (ref 6.4–8.3)

## 2017-03-28 NOTE — Progress Notes (Signed)
[facility]  [phone] Fax:([phone]) [phone]  Clinic Follow Up Note   Patient Care Team: Foye Spurling, MD as PCP - General (Internal Medicine) 03/28/2017  CHIEF COMPLAINTS:  Follow up Metastatic lung cancer  Oncology History   Cancer Staging Metastatic lung cancer (metastasis from lung to other site), unspecified laterality [facility]) Staging form: Lung, AJCC 8th Edition - Clinical stage from 02/20/2017: Stage IVB (cT1b, cN2, cM1c) - Signed by Truitt Merle, MD on 03/29/2017       Metastatic lung cancer (metastasis from lung to other site), unspecified laterality ([facility])   02/07/2017 Imaging    CT AP IMPRESSION: 1. Extensive retroperitoneal, left inguinal, right femoral, and mesenteric lymphadenopathy highly concerning for malignancy and which may reflect metastatic disease or lymphoma. No clear primary malignancy identified in the abdomen or pelvis. 2. Multiple splenic masses consistent with metastases. 3. Aortic atherosclerosis. These results will be called to the ordering clinician or representative by the Radiologist Assistant, and communication documented in the PACS or zVision Dashboard.      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 Microscopic Comment The lymph node is replaced by malignant cells. Immunohistochemistry reveals the cells are positive for cytokeratin 7 and TTF-1. p63 has weak non-specific staining. The cells are negative for cytokeratin 20, S100, GCDFP, ER, GATA3, cytokeratin 5/6, CD30, CD3, CD20, CD43, CD45, and ALK. The immunohistochemical profile is consistent with a primary lung adenocarcinoma. There is sufficient tissue for molecular testing if requested (block 1C). Correlation with radiologic data is recommended. Dr. Lyndon Code has reviewed the case. The case was called to [facility] at [facility] office on 02/25/2017.       02/20/2017 Surgery   Excision deep axillary mass with Dr. Dalbert Batman      02/27/2017 Initial Diagnosis    Metastatic lung cancer (metastasis from lung to other site), unspecified laterality (Penny Stewart)      02/27/2017 PET scan    IMPRESSION: 1. Hypermetabolic adenopathy in the chest, abdomen, and pelvis, with several hypermetabolic pulmonary nodules ; a faintly rim hypermetabolic splenic mass; and a questionably hypermetabolic lesion in the right iliac bone. The right axillary lymph node biopsy performed last week had immunohistochemical profile consistent with primary lung adenocarcinoma, and accordingly metastatic lung cancer is favored. 2. Possible metastatic involvement of the brain, there is abnormal white matter hypo density in the right cerebral hemisphere along with potential effacement of the anterior horn of the right lateral ventricle. MRI brain with and without contrast is recommended.      03/12/2017 Imaging    MR Brain W WO Contrast IMPRESSION: Numerous metastases with extensive edema throughout both cerebral hemispheres. 7 mm of leftward midline shift.       03/17/2017 -  Radiation Therapy    WBRT for brain mets         HISTORY OF PRESENTING ILLNESS:  Penny Stewart is a 71 year old woman referred for evaluation of lymphadenopathy. She reports weight loss over the past one to 2 years. Her husband reports more weight loss recently. For the past 2 months she has noted increasing fatigue and  in general not feeling well. 3-4 weeks ago she noted a "hard spot" in the left groin. She saw [facility] and was referred for CT scans of the abdomen and pelvis. She was found to have extensive retroperitoneal, left inguinal, right femoral and mesenteric lymphadenopathy. Multiple splenic masses were noted. She was referred to the [facility] for further evaluation.  The patient has a 15 year smoking history of 2-3 cigarettes daily. She quit in 2015/16. She has a glass of wine 1-2 x a week.   CURRENT THERAPY:  Pending systemic therapy with carboplatin and pemetrexed   INTERVAL HISTORY: Penny Stewart returns to the clinic today for follow up. She is joined today by her husband and son.  The patient recently had a Brain MRI showing numerous metastases with extensive edema throughout both cerebral hemispheres, as well as 17m of leftward midline shift.  Today, she presents to clinic for follow up. She reports radiation is going well. Her last treatment is 6/4. She denies any headaches. Her energy level is doing well and she is doing more throughout the day. She is also moving around more.    MEDICAL HISTORY:  1. Hypothyroidism 2. Hypertension 3. Hypercholesterolemia   SURGICAL HISTORY: Past Surgical History:  Procedure Laterality Date  . MASS EXCISION Right 02/20/2017   Procedure: EXCISION DEEP AXILLARY MASS;  Surgeon: HFanny Skates MD;  [facility]  Service: General;  Laterality: Right;   SOCIAL HISTORY: Social History   Social History  . Marital status: Married    Spouse name: N/A  . Number of children: N/A  . Years of education: N/A   Occupational History  . Not on file.   Social History Main Topics  . Smoking status: Former Smoker    Packs/day: 15.00    Years: 0.10    Quit date: 10/28/2014  . Smokeless tobacco: Never Used  . Alcohol use 0.6 oz/week    1 Glasses of wine per week     Comment: 1-2 times a week   . Drug use: No  . Sexual activity: Not on file   Other Topics Concern  . Not on file   Social History Narrative  . No narrative on file   [place] She is [place]. She has one son in good health. She is retired from [employer] She quit smoking 2 years ago, less than 1 pack per day for greater than 25 years. Every "2 or so days" she either has a glass of wine or a shot of cognac.  FAMILY HISTORY: Family History  Problem Relation Age of Onset  . Cancer Mother 748      colon cancer   . Cancer Sister 588      breast cancer      Mother deceased age 7720with colon cancer. Father deceased age 742with heart problems. 10 siblings, all deceased. One sister died with "bile duct cancer". She thinks another sister may have had cancer as well.  ALLERGIES:  No known drug allergies  MEDICATIONS:  Current Outpatient Prescriptions  Medication Sig Dispense Refill  . amLODipine (NORVASC) 10 MG tablet Take 10 mg by mouth daily at 12 noon.     .Marland Kitchenaspirin EC 81 MG tablet Take 81 mg by mouth daily.    .Marland Kitchenatorvastatin (LIPITOR) 20 MG tablet Take 20 mg by mouth at bedtime.     .Marland Kitchendexamethasone (DECADRON) 4 MG tablet Take 1 tablet (4 mg total) by mouth 3 (three) times daily. (Patient taking differently: Take 4 mg by mouth 2 (two) times daily. ) 90 tablet 0  . folic acid (FOLVITE) 1 MG tablet Take 1 tablet (1 mg total) by mouth daily. 60 tablet 2  . levothyroxine (SYNTHROID, LEVOTHROID) 75 MCG tablet Take 75 mcg by mouth daily before breakfast.    .  mirtazapine (REMERON) 30 MG tablet TK 1 T PO QD  1  . vitamin B-12 (CYANOCOBALAMIN) 1000 MCG tablet Take 1,000 mcg by mouth daily.    [name] HYDROcodone-acetaminophen (NORCO) 5-325 MG tablet Take 1-2 tablets by mouth every 6 (six) hours as needed for moderate pain or severe pain. (Patient not taking: Reported on 02/27/2017) 30 tablet 0   No current facility-administered medications for this visit.     REVIEW OF SYSTEMS:   Constitutional: Denies fevers, chills or abnormal night sweats.  (+) improved energy level Eyes: No vision change. No diplopia. Ears, nose, mouth, throat, and face: Denies mucositis or sore throat Respiratory: Denies cough, dyspnea or wheezes Cardiovascular: Denies palpitation, chest discomfort or lower extremity swelling Gastrointestinal:  Occasional mild nausea with certain scents. No dysphagia. Intermittent mild constipation. No bloody or black stools. Skin: Denies abnormal skin rashes Lymphatics: "Hard spot" left groin. Neurological: Denies numbness, tingling in the  extremities. GU: No hematuria or dysuria. Behavioral/Psych:  All other systems were reviewed with the patient and are negative.  PHYSICAL EXAMINATION:   Vitals:   03/28/17 1228  BP: (!) 153/73  Pulse: 98  Resp: 20  Temp: 98.4 F (36.9 C)   Filed Weights   03/28/17 1228  Weight: 100 lb 12.8 oz (45.7 kg)    GENERAL:alert, no distress and comfortable SKIN: Pale appearing. No rashes or significant lesions EYES: normal, conjunctiva are pink and non-injected, sclera clear OROPHARYNX:no exudate, no erythema and lips, buccal mucosa, and tongue normal  NECK: supple, thyroid normal size, non-tender, without nodularity LYMPH: Surgical scar in the right axilla after biopsy. 1.5 cm left axillary lymph node; 3 cm left inguinal lymph node; 2 cm right femoral lymph node Right axilla shows large incision which is healing well without sign of infection. LUNGS: clear to auscultation and percussion with normal breathing effort HEART: regular rate & rhythm and no murmurs and no lower extremity edema ABDOMEN:abdomen soft, non-tender and normal bowel sounds; no organomegaly. Musculoskeletal:no cyanosis of digits and no clubbing  PSYCH: alert & oriented x 3 with fluent speech NEURO: no focal motor/sensory deficits   LABORATORY DATA:  CBC Latest Ref Rng & Units 03/28/2017 02/18/2017 02/13/2017  WBC 3.9 - 10.3 10e3/uL 33.2(H) 12.9(H) 13.4(H)  Hemoglobin 11.6 - 15.9 g/dL 12.4 12.0 12.3  Hematocrit 34.8 - 46.6 % 38.2 37.7 38.0  Platelets 145 - 400 10e3/uL 226 245 217   CMP Latest Ref Rng & Units 03/28/2017 03/12/2017 02/18/2017  Glucose 70 - 140 mg/dl 129 - 87  BUN 7.0 - 26.0 mg/dL 22.5 - 18  Creatinine 0.6 - 1.1 mg/dL 0.7 0.70 0.63  Sodium 136 - 145 mEq/L 139 - 140  Potassium 3.5 - 5.1 mEq/L 4.7 - 3.9  Chloride 101 - 111 mmol/L - - 105  CO2 22 - 29 mEq/L 28 - 25  Calcium 8.4 - 10.4 mg/dL 9.4 - 9.5  Total Protein 6.4 - 8.3 g/dL 6.6 - 7.2  Total Bilirubin 0.20 - 1.20 mg/dL 0.39 - 0.4  Alkaline Phos  40 - 150 U/L 127 - 111  AST 5 - 34 U/L 31 - 22  ALT 0 - 55 U/L 59(H) - 11(L)   PATHOLOGY Diagnosis 02/20/17 Lymph node for lymphoma, 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 Microscopic Comment The lymph node is replaced by malignant cells. Immunohistochemistry reveals the cells are positive for cytokeratin 7 and TTF-1. p63 has weak non-specific staining. The cells are negative for cytokeratin 20, S100, GCDFP, ER, GATA3, cytokeratin 5/6, CD30, CD3,  CD20, CD43, CD45, and ALK. The immunohistochemical profile is consistent with a primary lung adenocarcinoma. There is sufficient tissue for molecular testing if requested (block 1C). Correlation with radiologic data is recommended. Dr. Lyndon Code has reviewed the case. The case was called to [facility] at [facility] office on 02/25/2017.      RADIOGRAPHIC STUDIES: I have personally reviewed the radiological images as listed and agreed with the findings in the report. Mr Jeri Cos Wo Contrast  Result Date: 03/12/2017 CLINICAL DATA:  Recently diagnosed metastatic lung cancer. Concern for intracranial metastatic disease on PET-CT. EXAM: MRI HEAD WITHOUT AND WITH CONTRAST TECHNIQUE: Multiplanar, multiecho pulse sequences of the brain and surrounding structures were obtained without and with intravenous contrast. CONTRAST:  52m MULTIHANCE GADOBENATE DIMEGLUMINE 529 MG/ML IV SOLN COMPARISON:  PET-CT 02/27/2017 FINDINGS: Brain: There is no acute infarct or extra-axial fluid collection. A chronic microhemorrhage is noted in the anterior left parietal lobe. Cerebral volume is normal. 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 There are numerous enhancing lesions in both cerebral hemispheres, estimated to be on the order of 40 total lesions. No infratentorial lesions are identified. The largest lesions include a solidly enhancing 1.7 x 1.4 cm mass in the anterior right temporal lobe and a partially cystic 1.6 x 1.6 cm mass at the  anterior right insula. The largest left cerebral hemispheric lesion measures 1.1 x 0.9 cm in the parietal lobe. There is extensive vasogenic edema throughout both cerebral hemispheres, right worse than left. There is partial effacement of the right lateral ventricle with 7 mm of leftward midline shift. There is no ventriculomegaly. Vascular: Major intracranial vascular flow voids are preserved. Skull and upper cervical spine: Unremarkable bone marrow signal. Sinuses/Orbits: Unremarkable orbits. Paranasal sinuses and mastoid air cells are clear. Other: None. 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 7 mm of leftward midline shift. These results will be called to the ordering clinician or representative by the Radiologist Assistant, and communication documented in the PACS or zVision Dashboard. Electronically Signed   By: ALogan BoresM.D.   On: 03/12/2017 15:45   Nm Pet Image Initial (pi) Skull Base To Thigh  Result Date: 02/27/2017 CLINICAL DATA:  Initial treatment strategy for splenic masses with abdominal and pelvic adenopathy on CT scan. EXAM: NUCLEAR MEDICINE PET SKULL BASE TO THIGH TECHNIQUE: 5.0 mCi F-18 FDG was injected intravenously. Full-ring PET imaging was performed from the skull base to thigh after the radiotracer. CT data was obtained and used for attenuation correction and anatomic localization. FASTING BLOOD GLUCOSE:  Value: 126 mg/dl COMPARISON:  02/07/2017 FINDINGS: NECK The amount of intracranial white matter hypodensity appears excessive, especially in the right temporal lobe, which may have some slightly reduced activity. Also, there seems to be some potential effacement of the frontal horn of the right lateral ventricle on image 1/4. Metastatic involvement of the brain is suspected although not directly demonstrated. Indistinct lymph nodes in the supraclavicular fossa bilaterally, with a suspected 8 mm left supraclavicular lymph node with  maximum SUV 3.2. CHEST Hypermetabolic anterior mediastinal, right paratracheal, subcarinal, right hilar, right infrahilar, than left axillary adenopathy observed. Left axillary lymph node measures 1.5 cm in short axis on image 45/4 and has maximum SUV of 5.9. Indistinctly marginated right paratracheal lymph node has short axis diameter 1.6 cm on image 53/4 and a maximum SUV of 7.6. Postoperative findings from right axillary dissection noted with a small fluid collection in the right subpectoral region, and gas in the axilla.  Centrilobular emphysema. Spiculated right upper lobe 1.9 by 1.3 cm pulmonary nodule on image 29/8, maximum SUV 4.5. 1.8 by 1.4 cm left lower lobe nodule on image 38/8 has lobular margins and a maximum SUV of 6.6. Background mediastinal blood pool activity 2.4. Coronary, aortic arch, and branch vessel atherosclerotic vascular disease. Small pericardial effusion. ABDOMEN/PELVIS The dominant 3.2 cm splenic mass has faint accentuated activity along its margins, maximum SUV 4.0. There is some mild heterogeneity of splenic activity. Background hepatic activity SUV 2.6. Hypermetabolic retroperitoneal and pelvic adenopathy is observed including the left periaortic, aortocaval, right inguinal, left inguinal, and left external iliac chains. The index upper abdominal left periaortic node measures 1.9 cm in short axis on image 96/4 and has maximum SUV 5.4. Index left inguinal lymph node measures 2.4 cm in short axis on image 157/4 and has maximum standard uptake value 6.5. There is also a mesenteric lymph node in the right central pelvis measuring 1.4 cm in short axis on image 138/4, maximum SUV 4.8. SKELETON Subtle focus of activity in the right iliac bone just above the acetabulum, maximum SUV 4.4. No other definite accentuated osseous activity, and this lesion is questionable. IMPRESSION: 1. Hypermetabolic adenopathy in the chest, abdomen, and pelvis, with several hypermetabolic pulmonary nodules ; a  faintly rim hypermetabolic splenic mass; and a questionably hypermetabolic lesion in the right iliac bone. The right axillary lymph node biopsy performed last week had immunohistochemical profile consistent with primary lung adenocarcinoma, and accordingly metastatic lung cancer is favored. 2. Possible metastatic involvement of the brain, there is abnormal white matter hypo density in the right cerebral hemisphere along with potential effacement of the anterior horn of the right lateral ventricle. MRI brain with and without contrast is recommended. Electronically Signed   By: Van Clines M.D.   On: 02/27/2017 12:05    ASSESSMENT & PLAN: AJEENAH HEINY is a 71 y.o. female who presents with:  1. 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  -Her biopsy immunostain showed positive CK 7 and TTF-1 1, which is consistent with lung adenocarcinoma. - We previously reviewed the PET scan reports and images from this morning with the patient and her family. It showed hypermetabolic right lung lesion with significant right hilar and mediastinal adenopathy, diffuse nodal metastasis, single right pelvic bone metastasis, and a hypermetabolic left lung lesion likely metastatic -We previously discussed the natural history of lung cancer, an incurable nature of her disease. The goal of therapy is palliative.  - I have requested Foundation One and PD-L1 testing to determine the best treatment options for the patient. The PD-L1 came back negative (0%), unfortunately she is not a candidate for first-line immunotherapy. The rest of the FO genomic testing results still pending -I reviewed her FO genomic testing results, which unfortunately showed no actionable mutations. 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  -Her overall performance status has improved greatly, likely related to steroids, I'll offer her first-line chemotherapy with carboplatin, Alimta and Avastin, for 4-6 cycles, followed by Alimta maintenance therapy --Chemotherapy consent: Side effects including but does not not limited to, fatigue, nausea, vomiting, diarrhea, hair loss, neuropathy, fluid retention, renal and kidney dysfunction, neutropenic fever, needed for blood transfusion, bleeding, hypertension, proteinuria, renal failure, bowel perforation, were discussed with patient in great detail. She  agrees to proceed. -The goal of therapy is palliative -She has received B12 injection, and currently on folic acid today -I'll consider low-dose carboplatin for the first cycle, to see if she can tolerate it well. - start chemo late next week  - Plan to repeat staging scan after 2 cycles of chemotherapy -chemo class next week  -We discussed port placement, patient prefers to use her peripheral vein at this point.  2. 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 Scan showed numerous metastases with extensive edema throughout both cerebral hemispheres, as well as 78m of leftward midline shift. I discussed the nature of metastatic disease to the brain, and the importance of considering whle brain radiation. -she has been tolerating whole brain radiation well - final radiation 6/4 -She is on tapering dose of dexamethasone  3. Anorexia/Weight Loss - The patient has lost approximately 20 lbs in the last several months. - She has previously reported a decreased appetite. - The patient is currently taking Remeron 30 mg once daily.  -Her appetite and energy level has improved lately, weight is stable.  4. Goal of care discussion  -We again discussed the incurable nature of her cancer, and the overall poor prognosis, especially if she does not have  good response to chemotherapy or progress on chemo -The patient understands the goal of care is palliative. -I previously recommend DNR/DNI, she will think about it   5. Family dynamics -The patient's son SKarle Starchvoiced his concern of that her husband (Sheldon's step father) does not allow other family members to be involved much in her care -we reviewed this with pt, her husband and son, due to her metastatic disease and poor performance status, she is likely going to require a lot of care at home. I encourage her family members, especially her children, to be involved in her care. She agrees. -I spoke with her son SFlorian Buffbefore   6.Goal of care discussion  -We again discussed the incurable nature of his/her cancer, and the overall poor prognosis, especially if she does not have good response to chemotherapy or progress on chemo -The patient understands the goal of care is palliative. -I recommend DNR/DNI, she will think about it    PLAN: -  chemo class next week - start chemo carboplatinum, Alimta and Avastin late next week or following week - prescribe nausea medication. She is already on dexamethasone, no additional dose needed to before chemotherapy - f/u before 1st infusion   No orders of the defined types were placed in this encounter.  The patient was encouraged to call or return to the clinic sooner with any questions or concerns.  I spent a total of 40 minutes for this visit,  50% face-to-face counseling.  This document serves as a record of services personally performed by YTruitt Merle MD. It was created on her behalf by TBrandt Loosen a trained medical scribe. The creation of this record is based on the scribe's personal observations and the provider's statements to them. This document has been checked and approved by the attending provider.     FTruitt Merle MD 03/28/2017

## 2017-03-28 NOTE — Telephone Encounter (Signed)
Confirmed 6/1 appts  at noon with pt per sch msg

## 2017-03-28 NOTE — Telephone Encounter (Signed)
Scheduled appt per 6/1 LOS - per YF okay for treatment to start 6/11

## 2017-03-29 ENCOUNTER — Encounter: Payer: Self-pay | Admitting: Hematology

## 2017-03-29 MED ORDER — ONDANSETRON HCL 8 MG PO TABS: 8.0000 mg | ORAL_TABLET | Freq: Two times a day (BID) | ORAL | 1 refills | Status: DC | PRN

## 2017-03-29 MED ORDER — PROCHLORPERAZINE MALEATE 10 MG PO TABS: 10.0000 mg | ORAL_TABLET | Freq: Four times a day (QID) | ORAL | 1 refills | Status: DC | PRN

## 2017-03-29 NOTE — Progress Notes (Signed)
START ON PATHWAY REGIMEN - Non-Small Cell Lung     A cycle is every 21 days:     Carboplatin      Pemetrexed      Bevacizumab   **Always confirm dose/schedule in your pharmacy ordering system**    Patient Characteristics: Stage IV Metastatic, Non Squamous, Initial Chemotherapy/Immunotherapy, PS = 0, 1, PD-L1 Expression Positive 1-49% (TPS) / Negative / Not Tested / Not a Candidate for Immunotherapy/ Awaiting Test Results AJCC T Category: T1b Current Disease Status: Distant Metastases AJCC N Category: N2 AJCC M Category: M1c AJCC 8 Stage Grouping: IVB Histology: Non Squamous Cell ROS1 Rearrangement Status: Negative T790M Mutation Status: Not Applicable - EGFR Mutation Negative/Unknown Other Mutations/Biomarkers: No Other Actionable Mutations PD-L1 Expression Status: PD-L1 Negative Chemotherapy/Immunotherapy LOT: Initial Chemotherapy/Immunotherapy Molecular Targeted Therapy: Not Appropriate ALK Translocation Status: Negative Would you be surprised if this patient died  in the next year? I would NOT be surprised if this patient died in the next year EGFR Mutation Status: Non-Sensitizing BRAF V600E Mutation Status: Negative Performance Status: PS = 0, 1  Intent of Therapy: Non-Curative / Palliative Intent, Discussed with Patient

## 2017-03-31 ENCOUNTER — Other Ambulatory Visit: Payer: Medicare Other

## 2017-03-31 ENCOUNTER — Encounter: Payer: Self-pay | Admitting: Radiation Oncology

## 2017-03-31 ENCOUNTER — Ambulatory Visit
Admission: RE | Admit: 2017-03-31 | Discharge: 2017-03-31 | Disposition: A | Discharge status: Home / Self Care | Payer: Medicare Other | Source: Ambulatory Visit | Attending: Radiation Oncology | Admitting: Radiation Oncology

## 2017-03-31 DIAGNOSIS — Z510 Encounter for antineoplastic radiation therapy: Secondary | ICD-10-CM | POA: Diagnosis not present

## 2017-04-01 NOTE — Progress Notes (Signed)
  Radiation Oncology         [phone] ________________________________  Name: Penny Stewart MRN: [id number]  Date: 03/31/2017  DOB: 07-24-1946  End of Treatment Note  Diagnosis:   Stage IV, [id number] NSCLC, adenocarcinoma of either right upper lobe versus left lower lobe with disease to the retroperitoneal, axillary, and inguinal nodes, right iliac bone, and brain  Indication for treatment:  Palliation       Radiation treatment dates:   03/17/17 - 03/31/17  Site/dose:   The whole brain was treated to 30 Gy in 10 fractions of 3 Gy  Beams/energy:   Right and Left radiation fields were treated using 6 MV X-rays with custom MLC collimation to shield the eyes and face.  The patient was immobilized with a thermoplastic mask and isocenter was verified with weekly port films.  Narrative: The patient tolerated radiation treatment relatively well. The patient denied pain, headaches, or dizziness. She had no skin changes in the treatment field, had a good appetite, and was on a Decadron taper.  Plan: The patient has completed radiation treatment. The patient will return to radiation oncology clinic for routine followup in one month. I advised them to call or return sooner if they have any questions or concerns related to their recovery or treatment.  The patient was instructed on following a Decadron taper. ________________________________ ------------------------------------------------  Jodelle Gross, MD, PhD  This document serves as a record of services personally performed by Kyung Rudd, MD. It was created on his behalf by Darcus Austin, a trained medical scribe. The creation of this record is based on the scribe's personal observations and the provider's statements to them. This document has been checked and approved by the attending provider.

## 2017-04-02 ENCOUNTER — Telehealth: Payer: Self-pay | Admitting: *Deleted

## 2017-04-02 NOTE — Telephone Encounter (Signed)
Pt called back and lvm. This returned call and gave her information about attached message.  She had question about 2 prescriptions when to start them. zofran start 3 days after chemo. Compazine to be able to start the night of chemo.

## 2017-04-02 NOTE — Telephone Encounter (Signed)
CALLED PATIENT TO INFORM OF FU WITH ALISON PERKINS ON 04-29-17 @ 3:30 PM, LVM FOR A RETURN CALL AND MAILED APPT. CARD

## 2017-04-04 NOTE — Addendum Note (Signed)
Addendum  created 04/04/17 1208 by Lyn Hollingshead, MD   Sign clinical note

## 2017-04-07 ENCOUNTER — Telehealth: Payer: Self-pay | Admitting: Radiation Oncology

## 2017-04-07 ENCOUNTER — Ambulatory Visit (HOSPITAL_BASED_OUTPATIENT_CLINIC_OR_DEPARTMENT_OTHER): Payer: Medicare Other | Admitting: Hematology

## 2017-04-07 ENCOUNTER — Ambulatory Visit (HOSPITAL_BASED_OUTPATIENT_CLINIC_OR_DEPARTMENT_OTHER): Payer: Medicare Other

## 2017-04-07 ENCOUNTER — Encounter: Payer: Self-pay | Admitting: Hematology

## 2017-04-07 ENCOUNTER — Other Ambulatory Visit (HOSPITAL_BASED_OUTPATIENT_CLINIC_OR_DEPARTMENT_OTHER): Payer: Medicare Other

## 2017-04-07 VITALS — BP 123/68 | HR 98

## 2017-04-07 VITALS — BP 140/87 | HR 110 | Temp 98.4°F | Resp 18 | Ht 59.5 in | Wt 106.0 lb

## 2017-04-07 DIAGNOSIS — R634 Abnormal weight loss: Secondary | ICD-10-CM

## 2017-04-07 DIAGNOSIS — C3490 Malignant neoplasm of unspecified part of unspecified bronchus or lung: Secondary | ICD-10-CM | POA: Diagnosis not present

## 2017-04-07 DIAGNOSIS — C7951 Secondary malignant neoplasm of bone: Secondary | ICD-10-CM

## 2017-04-07 DIAGNOSIS — C7931 Secondary malignant neoplasm of brain: Secondary | ICD-10-CM

## 2017-04-07 DIAGNOSIS — C779 Secondary and unspecified malignant neoplasm of lymph node, unspecified: Secondary | ICD-10-CM

## 2017-04-07 DIAGNOSIS — Z7189 Other specified counseling: Secondary | ICD-10-CM

## 2017-04-07 DIAGNOSIS — D696 Thrombocytopenia, unspecified: Secondary | ICD-10-CM | POA: Diagnosis not present

## 2017-04-07 DIAGNOSIS — Z5112 Encounter for antineoplastic immunotherapy: Secondary | ICD-10-CM | POA: Diagnosis not present

## 2017-04-07 DIAGNOSIS — D72829 Elevated white blood cell count, unspecified: Secondary | ICD-10-CM

## 2017-04-07 DIAGNOSIS — N939 Abnormal uterine and vaginal bleeding, unspecified: Secondary | ICD-10-CM | POA: Diagnosis not present

## 2017-04-07 DIAGNOSIS — Z5111 Encounter for antineoplastic chemotherapy: Secondary | ICD-10-CM | POA: Diagnosis not present

## 2017-04-07 DIAGNOSIS — R630 Anorexia: Secondary | ICD-10-CM | POA: Diagnosis not present

## 2017-04-07 LAB — COMPREHENSIVE METABOLIC PANEL
ALBUMIN: 3.3 g/dL — AB (ref 3.5–5.0)
ALK PHOS: 143 U/L (ref 40–150)
ALT: 38 U/L (ref 0–55)
AST: 20 U/L (ref 5–34)
Anion Gap: 10 mEq/L (ref 3–11)
BUN: 22.9 mg/dL (ref 7.0–26.0)
CO2: 24 meq/L (ref 22–29)
Calcium: 9.1 mg/dL (ref 8.4–10.4)
Chloride: 106 mEq/L (ref 98–109)
Creatinine: 0.8 mg/dL (ref 0.6–1.1)
EGFR: 89 mL/min/{1.73_m2} — ABNORMAL LOW (ref >90)
GLUCOSE: 203 mg/dL — AB (ref 70–140)
POTASSIUM: 4 meq/L (ref 3.5–5.1)
SODIUM: 140 meq/L (ref 136–145)
TOTAL PROTEIN: 6.3 g/dL — AB (ref 6.4–8.3)
Total Bilirubin: 0.33 mg/dL (ref 0.20–1.20)

## 2017-04-07 LAB — CBC WITH DIFFERENTIAL/PLATELET
BASO%: 0.1 % (ref 0.0–2.0)
Basophils Absolute: 0 10*3/uL (ref 0.0–0.1)
EOS%: 0 % (ref 0.0–7.0)
Eosinophils Absolute: 0 10*3/uL (ref 0.0–0.5)
HCT: 34.7 % — ABNORMAL LOW (ref 34.8–46.6)
HGB: 11.1 g/dL — ABNORMAL LOW (ref 11.6–15.9)
LYMPH%: 4.7 % — AB (ref 14.0–49.7)
MCH: 26.3 pg (ref 25.1–34.0)
MCHC: 32 g/dL (ref 31.5–36.0)
MCV: 82.2 fL (ref 79.5–101.0)
MONO#: 0.2 10*3/uL (ref 0.1–0.9)
MONO%: 1 % (ref 0.0–14.0)
NEUT%: 94.2 % — ABNORMAL HIGH (ref 38.4–76.8)
NEUTROS ABS: 18.8 10*3/uL — AB (ref 1.5–6.5)
Platelets: 120 10*3/uL — ABNORMAL LOW (ref 145–400)
RBC: 4.22 10*6/uL (ref 3.70–5.45)
RDW: 16.9 % — AB (ref 11.2–14.5)
WBC: 20 10*3/uL — AB (ref 3.9–10.3)
lymph#: 0.9 10*3/uL (ref 0.9–3.3)

## 2017-04-07 LAB — UA PROTEIN, DIPSTICK - CHCC

## 2017-04-07 MED ORDER — SODIUM CHLORIDE 0.9 % IV SOLN
Freq: Once | INTRAVENOUS | Status: AC
  Administered 2017-04-07: 16:00:00 via INTRAVENOUS
  Filled 2017-04-07: qty 5

## 2017-04-07 MED ORDER — PALONOSETRON HCL INJECTION 0.25 MG/5ML
INTRAVENOUS | Status: AC
  Filled 2017-04-07: qty 5

## 2017-04-07 MED ORDER — PALONOSETRON HCL INJECTION 0.25 MG/5ML
0.2500 mg | Freq: Once | INTRAVENOUS | Status: AC
  Administered 2017-04-07: 0.25 mg via INTRAVENOUS

## 2017-04-07 MED ORDER — SODIUM CHLORIDE 0.9 % IV SOLN
15.3000 mg/kg | Freq: Once | INTRAVENOUS | Status: AC
  Administered 2017-04-07: 700 mg via INTRAVENOUS
  Filled 2017-04-07: qty 16

## 2017-04-07 MED ORDER — SODIUM CHLORIDE 0.9 % IV SOLN
251.2000 mg | Freq: Once | INTRAVENOUS | Status: AC
  Administered 2017-04-07: 250 mg via INTRAVENOUS
  Filled 2017-04-07: qty 25

## 2017-04-07 MED ORDER — SODIUM CHLORIDE 0.9 % IV SOLN
500.0000 mg/m2 | Freq: Once | INTRAVENOUS | Status: AC
  Administered 2017-04-07: 700 mg via INTRAVENOUS
  Filled 2017-04-07: qty 20

## 2017-04-07 MED ORDER — SODIUM CHLORIDE 0.9 % IV SOLN
Freq: Once | INTRAVENOUS | Status: AC
  Administered 2017-04-07: 16:00:00 via INTRAVENOUS

## 2017-04-07 NOTE — Telephone Encounter (Signed)
LM for the patient to call back regarding taper instructions and for clarity on how she's currently taking her medication.

## 2017-04-07 NOTE — Progress Notes (Signed)
Met w/ pt to introduce myself as her Arboriculturist.  Unfortunately there aren't any foundations offering copay assistance for her Dx and the type of ins she has.  I offered the [place] and told her the income requirement but she exceeds the limit.  If anything changes regarding available funding I will contact the pt.

## 2017-04-07 NOTE — Progress Notes (Signed)
[place]  [phone]) [phone] [phone]) [phone]  Clinic Follow Up Note   Patient Care Team: Foye Spurling, MD as PCP - General (Internal Medicine) 04/07/2017  CHIEF COMPLAINTS:  Metastatic lung cancer  Oncology History   Cancer Staging Metastatic lung cancer (metastasis from lung to other site), unspecified laterality [facility]) Staging form: Lung, AJCC 8th Edition - Clinical stage from 02/20/2017: Stage IVB (cT1b, cN2, cM1c) - Signed by Truitt Merle, MD on 03/29/2017       Metastatic lung cancer (metastasis from lung to other site), unspecified laterality ([place])   02/07/2017 Imaging    CT AP IMPRESSION: 1. Extensive retroperitoneal, left inguinal, right femoral, and mesenteric lymphadenopathy highly concerning for malignancy and which may reflect metastatic disease or lymphoma. No clear primary malignancy identified in the abdomen or pelvis. 2. Multiple splenic masses consistent with metastases. 3. Aortic atherosclerosis. These results will be called to the ordering clinician or representative by the Radiologist Assistant, and communication documented in the PACS or zVision Dashboard.      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 Microscopic Comment The lymph node is replaced by malignant cells. Immunohistochemistry reveals the cells are positive for cytokeratin 7 and TTF-1. p63 has weak non-specific staining. The cells are negative for cytokeratin 20, S100, GCDFP, ER, GATA3, cytokeratin 5/6, CD30, CD3, CD20, CD43, CD45, and ALK. The immunohistochemical profile is consistent with a primary lung adenocarcinoma. There is sufficient tissue for molecular testing if requested (block 1C). Correlation with radiologic data is recommended. Dr. Lyndon Code has reviewed the case. The case was called to Abigail Butts at Dr. Darrel Hoover office on 02/25/2017.       02/20/2017 Surgery    Excision  deep axillary mass with Dr. Dalbert Batman      02/27/2017 Initial Diagnosis    Metastatic lung cancer (metastasis from lung to other site), unspecified laterality ([place])      02/27/2017 PET scan    IMPRESSION: 1. Hypermetabolic adenopathy in the chest, abdomen, and pelvis, with several hypermetabolic pulmonary nodules ; a faintly rim hypermetabolic splenic mass; and a questionably hypermetabolic lesion in the right iliac bone. The right axillary lymph node biopsy performed last week had immunohistochemical profile consistent with primary lung adenocarcinoma, and accordingly metastatic lung cancer is favored. 2. Possible metastatic involvement of the brain, there is abnormal white matter hypo density in the right cerebral hemisphere along with potential effacement of the anterior horn of the right lateral ventricle. MRI brain with and without contrast is recommended.      03/12/2017 Imaging    MR Brain W WO Contrast IMPRESSION: Numerous metastases with extensive edema throughout both cerebral hemispheres. 7 mm of leftward midline shift.       03/17/2017 - 03/31/2017 Radiation Therapy    Site/dose:   The whole brain was treated to 30 Gy in 10 fractions of 3 Gy by Dr. Lisbeth Renshaw.      04/07/2017 -  Chemotherapy    Carbo, Alimta, and Avastin every 3 weeks, starting 04/07/17. Carbo reduced to AUC 4 due to her advanced age and performance status.        HISTORY OF PRESENTING ILLNESS:  Penny Stewart is a 71 year old woman referred for evaluation of lymphadenopathy. She reports weight loss over the past one to 2 years. Her husband reports more weight loss recently. For the past 2 months she has noted increasing fatigue and  in general not feeling well. 3-4 weeks ago  she noted a "hard spot" in the left groin. She saw Dr. Carlis Abbott and was referred for CT scans of the abdomen and pelvis. She was found to have extensive retroperitoneal, left inguinal, right femoral and mesenteric lymphadenopathy. Multiple splenic  masses were noted. She was referred to the [facility] for further evaluation.  The patient has a 15 year smoking history of 2-3 cigarettes daily. She quit in 2015/16. She has a glass of wine 1-2 x a week.   CURRENT THERAPY: Carbo, Alimta, and Avastin every 3 weeks, starting 04/07/17.  INTERVAL HISTORY: Penny Stewart returns to the clinic today for follow up. She is joined today by her husband. The patient completed whole brain radiation therapy on 03/31/17. She reports a good appetite and has gained some weight. She is on Decadron 4 mg once daily, but they are not sure when she is supposed to stop. She reports an instance of vaginal spotting without pain or discharge.  Past Medical History:  Diagnosis Date  . Adenopathy 02/07/2017  . Hypertension   . Hypothyroidism    SURGICAL HISTORY: Past Surgical History:  Procedure Laterality Date  . MASS EXCISION Right 02/20/2017   Procedure: EXCISION DEEP AXILLARY MASS;  Surgeon: Fanny Skates, MD;  Location: [facility];  Service: General;  Laterality: Right;   SOCIAL HISTORY: Social History   Social History  . Marital status: Married    Spouse name: N/A  . Number of children: N/A  . Years of education: N/A   Occupational History  . Not on file.   Social History Main Topics  . Smoking status: Former Smoker    Packs/day: 15.00    Years: 0.10    Quit date: 10/28/2014  . Smokeless tobacco: Never Used  . Alcohol use 0.6 oz/week    1 Glasses of wine per week     Comment: 1-2 times a week   . Drug use: No  . Sexual activity: Not on file   Other Topics Concern  . Not on file   Social History Narrative  . No narrative on file   She lives in [place]. She is married. She has one son in good health. She is retired from [employer]. She quit smoking 2 years ago, less than 1 pack per day for greater than 25 years. Every "2 or so days" she either has a glass of wine or a shot of cognac.  FAMILY HISTORY: Family History    Problem Relation Age of Onset  . Cancer Mother 46       colon cancer   . Cancer Sister 65       breast cancer    Mother deceased age 65 with colon cancer. Father deceased age 63 with heart problems. 10 siblings, all deceased. One sister died with "bile duct cancer". She thinks another sister may have had cancer as well.  ALLERGIES:  No known drug allergies  MEDICATIONS:  Current Outpatient Prescriptions  Medication Sig Dispense Refill  . amLODipine (NORVASC) 10 MG tablet Take 10 mg by mouth daily at 12 noon.     [name] aspirin EC 81 MG tablet Take 81 mg by mouth daily.    [name] atorvastatin (LIPITOR) 20 MG tablet Take 20 mg by mouth at bedtime.     [name] dexamethasone (DECADRON) 4 MG tablet Take 1 tablet (4 mg total) by mouth 3 (three) times daily. (Patient taking differently: Take 4 mg by mouth daily. ) 90 tablet 0  . folic acid (FOLVITE) 1  MG tablet Take 1 tablet (1 mg total) by mouth daily. 60 tablet 2  . levothyroxine (SYNTHROID, LEVOTHROID) 75 MCG tablet Take 75 mcg by mouth daily before breakfast.    . mirtazapine (REMERON) 30 MG tablet TK 1 T PO QD  1  . ondansetron (ZOFRAN) 8 MG tablet Take 1 tablet (8 mg total) by mouth 2 (two) times daily as needed for refractory nausea / vomiting. Start on day 3 after chemo. 30 tablet 1  . prochlorperazine (COMPAZINE) 10 MG tablet Take 1 tablet (10 mg total) by mouth every 6 (six) hours as needed (Nausea or vomiting). 30 tablet 1  . vitamin B-12 (CYANOCOBALAMIN) 1000 MCG tablet Take 1,000 mcg by mouth daily.    [name] HYDROcodone-acetaminophen (NORCO) 5-325 MG tablet Take 1-2 tablets by mouth every 6 (six) hours as needed for moderate pain or severe pain. (Patient not taking: Reported on 02/27/2017) 30 tablet 0   No current facility-administered medications for this visit.    Facility-Administered Medications Ordered in Other Visits  Medication Dose Route Frequency Provider Last Rate Last Dose  . bevacizumab (AVASTIN) 700 mg in sodium chloride 0.9 % 100 mL  chemo infusion  15.3 mg/kg (Treatment Plan Recorded) Intravenous Once Truitt Merle, MD      . CARBOplatin (PARAPLATIN) 250 mg in sodium chloride 0.9 % 250 mL chemo infusion  250 mg Intravenous Once Truitt Merle, MD      . fosaprepitant (EMEND) 150 mg, dexamethasone (DECADRON) 12 mg in sodium chloride 0.9 % 145 mL IVPB   Intravenous Once Truitt Merle, MD 454 mL/hr at 04/07/17 1549    . PEMEtrexed (ALIMTA) 700 mg in sodium chloride 0.9 % 100 mL chemo infusion  500 mg/m2 (Treatment Plan Recorded) Intravenous Once Truitt Merle, MD        REVIEW OF SYSTEMS:   Constitutional: Denies fevers, chills or abnormal night sweats. (+) Increased appetite. (+) Weight gain. Eyes: No vision change. No diplopia. Ears, nose, mouth, throat, and face: Denies mucositis or sore throat Respiratory: Denies cough, dyspnea or wheezes Cardiovascular: Denies palpitation, chest discomfort or lower extremity swelling Gastrointestinal:  Occasional mild nausea with certain scents. No dysphagia. Intermittent mild constipation. No bloody or black stools. Skin: Denies abnormal skin rashes Lymphatics: "Hard spot" left groin. Neurological: Denies numbness, tingling in the extremities. GU: No hematuria or dysuria.(+) Vaginal spotting. Behavioral/Psych: (+) Husband has noted recent memory issues. All other systems were reviewed with the patient and are negative.  PHYSICAL EXAMINATION:  Vitals:   04/07/17 1434  BP: 140/87  Pulse: (!) 110  Resp: 18  Temp: 98.4 F (36.9 C)   Filed Weights   04/07/17 1434  Weight: 106 lb (48.1 kg)    GENERAL:alert, no distress and comfortable SKIN: Pale appearing. No rashes or significant lesions EYES: normal, conjunctiva are pink and non-injected, sclera clear OROPHARYNX:no exudate, no erythema and lips, buccal mucosa, and tongue normal  NECK: supple, thyroid normal size, non-tender, without nodularity LYMPH: (+) Surgical scar in the right axilla after biopsy. Healed well. Multiple bilateral  axillary lymph nodes with the largest left node 3.5 cm and right node 1.5 cm. 2.5 cm right groin lymph node. LUNGS: clear to auscultation and percussion with normal breathing effort HEART: regular rate & rhythm and no murmurs and no lower extremity edema ABDOMEN:abdomen soft, non-tender and normal bowel sounds; no organomegaly. Musculoskeletal:no cyanosis of digits and no clubbing  PSYCH: alert & oriented x 3 with fluent speech NEURO: no focal motor/sensory deficits BREAST: No mass palpated in either  breast  LABORATORY DATA:  CBC Latest Ref Rng & Units 04/07/2017 03/28/2017 02/18/2017  WBC 3.9 - 10.3 10e3/uL 20.0(H) 33.2(H) 12.9(H)  Hemoglobin 11.6 - 15.9 g/dL 11.1(L) 12.4 12.0  Hematocrit 34.8 - 46.6 % 34.7(L) 38.2 37.7  Platelets 145 - 400 10e3/uL 120(L) 226 245   CMP Latest Ref Rng & Units 04/07/2017 03/28/2017 03/12/2017  Glucose 70 - 140 mg/dl 203(H) 129 -  BUN 7.0 - 26.0 mg/dL 22.9 22.5 -  Creatinine 0.6 - 1.1 mg/dL 0.8 0.7 0.70  Sodium 136 - 145 mEq/L 140 139 -  Potassium 3.5 - 5.1 mEq/L 4.0 4.7 -  Chloride 101 - 111 mmol/L - - -  CO2 22 - 29 mEq/L 24 28 -  Calcium 8.4 - 10.4 mg/dL 9.1 9.4 -  Total Protein 6.4 - 8.3 g/dL 6.3(L) 6.6 -  Total Bilirubin 0.20 - 1.20 mg/dL 0.33 0.39 -  Alkaline Phos 40 - 150 U/L 143 127 -  AST 5 - 34 U/L 20 31 -  ALT 0 - 55 U/L 38 59(H) -   PATHOLOGY Diagnosis 02/20/17 Lymph node for lymphoma, 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 Microscopic Comment The lymph node is replaced by malignant cells. Immunohistochemistry reveals the cells are positive for cytokeratin 7 and TTF-1. p63 has weak non-specific staining. The cells are negative for cytokeratin 20, S100, GCDFP, ER, GATA3, cytokeratin 5/6, CD30, CD3, CD20, CD43, CD45, and ALK. The immunohistochemical profile is consistent with a primary lung adenocarcinoma. There is sufficient tissue for molecular testing if requested (block 1C). Correlation with radiologic data is  recommended. Dr. Lyndon Code has reviewed the case. The case was called to Abigail Butts at Dr. Darrel Hoover office on 02/25/2017.     RADIOGRAPHIC STUDIES: I have personally reviewed the radiological images as listed and agreed with the findings in the report. Mr Jeri Cos Wo Contrast  Result Date: 03/12/2017 CLINICAL DATA:  Recently diagnosed metastatic lung cancer. Concern for intracranial metastatic disease on PET-CT. EXAM: MRI HEAD WITHOUT AND WITH CONTRAST TECHNIQUE: Multiplanar, multiecho pulse sequences of the brain and surrounding structures were obtained without and with intravenous contrast. CONTRAST:  80m MULTIHANCE GADOBENATE DIMEGLUMINE 529 MG/ML IV SOLN COMPARISON:  PET-CT 02/27/2017 FINDINGS: Brain: There is no acute infarct or extra-axial fluid collection. A chronic microhemorrhage is noted in the anterior left parietal lobe. Cerebral volume is normal. 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 There are numerous enhancing lesions in both cerebral hemispheres, estimated to be on the order of 40 total lesions. No infratentorial lesions are identified. The largest lesions include a solidly enhancing 1.7 x 1.4 cm mass in the anterior right temporal lobe and a partially cystic 1.6 x 1.6 cm mass at the anterior right insula. The largest left cerebral hemispheric lesion measures 1.1 x 0.9 cm in the parietal lobe. There is extensive vasogenic edema throughout both cerebral hemispheres, right worse than left. There is partial effacement of the right lateral ventricle with 7 mm of leftward midline shift. There is no ventriculomegaly. Vascular: Major intracranial vascular flow voids are preserved. Skull and upper cervical spine: Unremarkable bone marrow signal. Sinuses/Orbits: Unremarkable orbits. Paranasal sinuses and mastoid air cells are clear. Other: None. 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 7 mm of leftward midline shift. These results will be  called to the ordering clinician or representative by the Radiologist Assistant, and communication documented in the PACS or zVision Dashboard. Electronically Signed   By: ALogan BoresM.D.   On: 03/12/2017  15:45    ASSESSMENT & PLAN: Penny Stewart is a 70 y.o. female who presents with:  1. 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  -Her biopsy immunostain showed positive CK 7 and TTF-1 1, which is consistent with lung adenocarcinoma. - We previously reviewed the PET scan reports and images from this morning with the patient and her family. It showed hypermetabolic right lung lesion with significant right hilar and mediastinal adenopathy, diffuse nodal metastasis, single right pelvic bone metastasis, and a hypermetabolic left lung lesion likely metastatic -We previously discussed the natural history of lung cancer, an incurable nature of her disease. The goal of therapy is palliative.  - I have requested Foundation One and PD-L1 testing to determine the best treatment options for the patient. The PD-L1 came back negative (0%), unfortunately she is not a candidate for first-line immunotherapy.  -I have reviewed her Foundation one to number testing results, unfortunately, she does not have actionable mutations in her tumor. 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 -She completed WBRT on 03/31/17. -She is clinically doing moderately well, with some fatigue, but overall improved, partially related to her steroids, Reviewed, adequate for treatment, we'll start first cycle chemotherapy today with carboplatin, Alimta and Avastin -Due to her modest thrombocytopenia, advanced age, will reduce Carbo to AUC 4. -We again reviewed potential side effects from chemotherapy, especially neutropenic fever, fatigue, nausea  etc. -She has Zofran and Compazine at home. -She does not have a port, but agreeable to have port placement if her IV access becomes an issue -Continue chemotherapy every 3 weeks, plan to repeat a PET scan or CT after 2 cycles of treatment  2. 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 -dexa tapering per Dr. Lisbeth Renshaw -f/u with Dr. Lisbeth Renshaw   3. Anorexia/Weight Loss - The patient has lost approximately 20 lbs in the last several months. - She has previously reported a decreased appetite. - The patient is currently taking Remeron 30 mg once daily.  -Her appetite has quite a significantly improved, partially related to stay with and Remeron, she has gained 6 pounds in the past 2 weeks  4. Vaginal Spotting  -The patient reports having an instance of vaginal spotting without pain or discharge. -monitor clinically, no other signs of bleeding   5. Thrombocytopenia  -Platelets dropped to 120K on 04/07/17. The cause is uncertain, possible related to radiation. It was 236K on 03/28/17. -Chemotherapy Carbo has already been reduced due to her age and performance status.  6. Leukocytosis -Likely secondary to steroids, no signs of infection. We'll continue monitoring.  7. Goal of care discussion  -We discussed the incurable nature of her cancer, and the overall poor prognosis, especially if she does not have good response to chemotherapy or progress on chemo -The patient understands the goal of care is palliative. -I recommend DNR/DNI, she will think about it    PLAN: -She will start Carbo, Alimta, and Avastin today. Will reduce Carbo to AUC 4 due to her advanced age and performance status. -Lab and f/u with APP next week. -Lab, f/u and chemo Carbo, Alimta, and Avastin in 3 weeks, will likely increase her carbo dose if she tolerates well  -I contacted radiology NP Bryson Ha regarding tapering Decadron,  she will contact pt    No orders of the defined types were placed in this encounter.  The patient was encouraged to call or return to the clinic sooner with any questions or concerns.  I spent a total of 30 minutes for this visit, wasn't 50% face-to-face counseling.    Truitt Merle, MD 04/07/2017   This document serves as a record of services personally performed by Truitt Merle, MD. It was created on her behalf by Darcus Austin, a trained medical scribe. The creation of this record is based on the scribe's personal observations and the provider's statements to them. This document has been checked and approved by the attending provider.

## 2017-04-07 NOTE — Patient Instructions (Signed)
[facility] Discharge Instructions for Patients Receiving Chemotherapy  Today you received the following chemotherapy agents Avastin, Alimta, & Carboplatin  To help prevent nausea and vomiting after your treatment, we encourage you to take your nausea medication as prescribed.  If you develop nausea and vomiting that is not controlled by your nausea medication, call the clinic.   BELOW ARE SYMPTOMS THAT SHOULD BE REPORTED IMMEDIATELY:  *FEVER GREATER THAN 100.5 F  *CHILLS WITH OR WITHOUT FEVER  NAUSEA AND VOMITING THAT IS NOT CONTROLLED WITH YOUR NAUSEA MEDICATION  *UNUSUAL SHORTNESS OF BREATH  *UNUSUAL BRUISING OR BLEEDING  TENDERNESS IN MOUTH AND THROAT WITH OR WITHOUT PRESENCE OF ULCERS  *URINARY PROBLEMS  *BOWEL PROBLEMS  UNUSUAL RASH Items with * indicate a potential emergency and should be followed up as soon as possible.  Feel free to call the clinic you have any questions or concerns. The clinic phone number is [phone].  Please show the [facility] at check-in to the Emergency Department and triage nurse.   Bevacizumab injection (Avastin)  What is this medicine? BEVACIZUMAB (be va SIZ yoo mab) is a monoclonal antibody. It is used to treat many types of cancer. This medicine may be used for other purposes; ask your health care provider or pharmacist if you have questions. COMMON BRAND NAME(S): Avastin What should I tell my health care provider before I take this medicine? They need to know if you have any of these conditions: -diabetes -heart disease -high blood pressure -history of coughing up blood -prior anthracycline chemotherapy (e.g., doxorubicin, daunorubicin, epirubicin) -recent or ongoing radiation therapy -recent or planning to have surgery -stroke -an unusual or allergic reaction to bevacizumab, hamster proteins, mouse proteins, other medicines, foods, dyes, or preservatives -pregnant or trying to get  pregnant -breast-feeding How should I use this medicine? This medicine is for infusion into a vein. It is given by a health care professional in a hospital or clinic setting. Talk to your pediatrician regarding the use of this medicine in children. Special care may be needed. Overdosage: If you think you have taken too much of this medicine contact a poison control center or emergency room at once. NOTE: This medicine is only for you. Do not share this medicine with others. What if I miss a dose? It is important not to miss your dose. Call your doctor or health care professional if you are unable to keep an appointment. What may interact with this medicine? Interactions are not expected. This list may not describe all possible interactions. Give your health care provider a list of all the medicines, herbs, non-prescription drugs, or dietary supplements you use. Also tell them if you smoke, drink alcohol, or use illegal drugs. Some items may interact with your medicine. What should I watch for while using this medicine? Your condition will be monitored carefully while you are receiving this medicine. You will need important blood work and urine testing done while you are taking this medicine. This medicine may increase your risk to bruise or bleed. Call your doctor or health care professional if you notice any unusual bleeding. This medicine should be started at least 28 days following major surgery and the site of the surgery should be totally healed. Check with your doctor before scheduling dental work or surgery while you are receiving this treatment. Talk to your doctor if you have recently had surgery or if you have a wound that has not healed. Do not become pregnant while taking this medicine  or for 6 months after stopping it. Women should inform their doctor if they wish to become pregnant or think they might be pregnant. There is a potential for serious side effects to an unborn child. Talk to  your health care professional or pharmacist for more information. Do not breast-feed an infant while taking this medicine and for 6 months after the last dose. This medicine has caused ovarian failure in some women. This medicine may interfere with the ability to have a child. You should talk to your doctor or health care professional if you are concerned about your fertility. What side effects may I notice from receiving this medicine? Side effects that you should report to your doctor or health care professional as soon as possible: -allergic reactions like skin rash, itching or hives, swelling of the face, lips, or tongue -chest pain or chest tightness -chills -coughing up blood -high fever -seizures -severe constipation -signs and symptoms of bleeding such as bloody or black, tarry stools; red or dark-brown urine; spitting up blood or brown material that looks like coffee grounds; red spots on the skin; unusual bruising or bleeding from the eye, gums, or nose -signs and symptoms of a blood clot such as breathing problems; chest pain; severe, sudden headache; pain, swelling, warmth in the leg -signs and symptoms of a stroke like changes in vision; confusion; trouble speaking or understanding; severe headaches; sudden numbness or weakness of the face, arm or leg; trouble walking; dizziness; loss of balance or coordination -stomach pain -sweating -swelling of legs or ankles -vomiting -weight gain Side effects that usually do not require medical attention (report to your doctor or health care professional if they continue or are bothersome): -back pain -changes in taste -decreased appetite -dry skin -nausea -tiredness This list may not describe all possible side effects. Call your doctor for medical advice about side effects. You may report side effects to FDA at 1-800-FDA-1088. Where should I keep my medicine? This drug is given in a hospital or clinic and will not be stored at  home. NOTE: This sheet is a summary. It may not cover all possible information. If you have questions about this medicine, talk to your doctor, pharmacist, or health care provider.  2018 Elsevier/Gold Standard (2016-10-11 14:33:29)  Pemetrexed injection (Alimta) What is this medicine? PEMETREXED (PEM e TREX ed) is a chemotherapy drug used to treat lung cancers like non-small cell lung cancer and mesothelioma. It may also be used to treat other cancers. This medicine may be used for other purposes; ask your health care provider or pharmacist if you have questions. COMMON BRAND NAME(S): Alimta What should I tell my health care provider before I take this medicine? They need to know if you have any of these conditions: -infection (especially a virus infection such as chickenpox, cold sores, or herpes) -kidney disease -low blood counts, like low white cell, platelet, or red cell counts -lung or breathing disease, like asthma -radiation therapy -an unusual or allergic reaction to pemetrexed, other medicines, foods, dyes, or preservative -pregnant or trying to get pregnant -breast-feeding How should I use this medicine? This drug is given as an infusion into a vein. It is administered in a hospital or clinic by a specially trained health care professional. Talk to your pediatrician regarding the use of this medicine in children. Special care may be needed. Overdosage: If you think you have taken too much of this medicine contact a poison control center or emergency room at once. NOTE: This medicine is  only for you. Do not share this medicine with others. What if I miss a dose? It is important not to miss your dose. Call your doctor or health care professional if you are unable to keep an appointment. What may interact with this medicine? This medicine may interact with the following medications: -Ibuprofen This list may not describe all possible interactions. Give your health care provider a  list of all the medicines, herbs, non-prescription drugs, or dietary supplements you use. Also tell them if you smoke, drink alcohol, or use illegal drugs. Some items may interact with your medicine. What should I watch for while using this medicine? Visit your doctor for checks on your progress. This drug may make you feel generally unwell. This is not uncommon, as chemotherapy can affect healthy cells as well as cancer cells. Report any side effects. Continue your course of treatment even though you feel ill unless your doctor tells you to stop. In some cases, you may be given additional medicines to help with side effects. Follow all directions for their use. Call your doctor or health care professional for advice if you get a fever, chills or sore throat, or other symptoms of a cold or flu. Do not treat yourself. This drug decreases your body's ability to fight infections. Try to avoid being around people who are sick. This medicine may increase your risk to bruise or bleed. Call your doctor or health care professional if you notice any unusual bleeding. Be careful brushing and flossing your teeth or using a toothpick because you may get an infection or bleed more easily. If you have any dental work done, tell your dentist you are receiving this medicine. Avoid taking products that contain aspirin, acetaminophen, ibuprofen, naproxen, or ketoprofen unless instructed by your doctor. These medicines may hide a fever. Call your doctor or health care professional if you get diarrhea or mouth sores. Do not treat yourself. To protect your kidneys, drink water or other fluids as directed while you are taking this medicine. Do not become pregnant while taking this medicine or for 6 months after stopping it. Women should inform their doctor if they wish to become pregnant or think they might be pregnant. Men should not father a child while taking this medicine and for 3 months after stopping it. This may  interfere with the ability to father a child. You should talk to your doctor or health care professional if you are concerned about your fertility. There is a potential for serious side effects to an unborn child. Talk to your health care professional or pharmacist for more information. Do not breast-feed an infant while taking this medicine or for 1 week after stopping it. What side effects may I notice from receiving this medicine? Side effects that you should report to your doctor or health care professional as soon as possible: -allergic reactions like skin rash, itching or hives, swelling of the face, lips, or tongue -breathing problems -redness, blistering, peeling or loosening of the skin, including inside the mouth -signs and symptoms of bleeding such as bloody or black, tarry stools; red or dark-brown urine; spitting up blood or brown material that looks like coffee grounds; red spots on the skin; unusual bruising or bleeding from the eye, gums, or nose -signs and symptoms of infection like fever or chills; cough; sore throat; pain or trouble passing urine -signs and symptoms of kidney injury like trouble passing urine or change in the amount of urine -signs and symptoms of liver injury  like dark yellow or brown urine; general ill feeling or flu-like symptoms; light-colored stools; loss of appetite; nausea; right upper belly pain; unusually weak or tired; yellowing of the eyes or skin Side effects that usually do not require medical attention (report to your doctor or health care professional if they continue or are bothersome): -constipation -dizziness -mouth sores -nausea, vomiting -pain, tingling, numbness in the hands or feet -unusually weak or tired This list may not describe all possible side effects. Call your doctor for medical advice about side effects. You may report side effects to FDA at 1-800-FDA-1088. Where should I keep my medicine? This drug is given in a hospital or  clinic and will not be stored at home. NOTE: This sheet is a summary. It may not cover all possible information. If you have questions about this medicine, talk to your doctor, pharmacist, or health care provider.  2018 Elsevier/Gold Standard (2016-08-13 18:51:46)  Carboplatin injection What is this medicine? CARBOPLATIN (KAR boe pla tin) is a chemotherapy drug. It targets fast dividing cells, like cancer cells, and causes these cells to die. This medicine is used to treat ovarian cancer and many other cancers. This medicine may be used for other purposes; ask your health care provider or pharmacist if you have questions. COMMON BRAND NAME(S): Paraplatin What should I tell my health care provider before I take this medicine? They need to know if you have any of these conditions: -blood disorders -hearing problems -kidney disease -recent or ongoing radiation therapy -an unusual or allergic reaction to carboplatin, cisplatin, other chemotherapy, other medicines, foods, dyes, or preservatives -pregnant or trying to get pregnant -breast-feeding How should I use this medicine? This drug is usually given as an infusion into a vein. It is administered in a hospital or clinic by a specially trained health care professional. Talk to your pediatrician regarding the use of this medicine in children. Special care may be needed. Overdosage: If you think you have taken too much of this medicine contact a poison control center or emergency room at once. NOTE: This medicine is only for you. Do not share this medicine with others. What if I miss a dose? It is important not to miss a dose. Call your doctor or health care professional if you are unable to keep an appointment. What may interact with this medicine? -medicines for seizures -medicines to increase blood counts like filgrastim, pegfilgrastim, sargramostim -some antibiotics like amikacin, gentamicin, neomycin, streptomycin,  tobramycin -vaccines Talk to your doctor or health care professional before taking any of these medicines: -acetaminophen -aspirin -ibuprofen -ketoprofen -naproxen This list may not describe all possible interactions. Give your health care provider a list of all the medicines, herbs, non-prescription drugs, or dietary supplements you use. Also tell them if you smoke, drink alcohol, or use illegal drugs. Some items may interact with your medicine. What should I watch for while using this medicine? Your condition will be monitored carefully while you are receiving this medicine. You will need important blood work done while you are taking this medicine. This drug may make you feel generally unwell. This is not uncommon, as chemotherapy can affect healthy cells as well as cancer cells. Report any side effects. Continue your course of treatment even though you feel ill unless your doctor tells you to stop. In some cases, you may be given additional medicines to help with side effects. Follow all directions for their use. Call your doctor or health care professional for advice if you get a fever,  chills or sore throat, or other symptoms of a cold or flu. Do not treat yourself. This drug decreases your body's ability to fight infections. Try to avoid being around people who are sick. This medicine may increase your risk to bruise or bleed. Call your doctor or health care professional if you notice any unusual bleeding. Be careful brushing and flossing your teeth or using a toothpick because you may get an infection or bleed more easily. If you have any dental work done, tell your dentist you are receiving this medicine. Avoid taking products that contain aspirin, acetaminophen, ibuprofen, naproxen, or ketoprofen unless instructed by your doctor. These medicines may hide a fever. Do not become pregnant while taking this medicine. Women should inform their doctor if they wish to become pregnant or think  they might be pregnant. There is a potential for serious side effects to an unborn child. Talk to your health care professional or pharmacist for more information. Do not breast-feed an infant while taking this medicine. What side effects may I notice from receiving this medicine? Side effects that you should report to your doctor or health care professional as soon as possible: -allergic reactions like skin rash, itching or hives, swelling of the face, lips, or tongue -signs of infection - fever or chills, cough, sore throat, pain or difficulty passing urine -signs of decreased platelets or bleeding - bruising, pinpoint red spots on the skin, black, tarry stools, nosebleeds -signs of decreased red blood cells - unusually weak or tired, fainting spells, lightheadedness -breathing problems -changes in hearing -changes in vision -chest pain -high blood pressure -low blood counts - This drug may decrease the number of white blood cells, red blood cells and platelets. You may be at increased risk for infections and bleeding. -nausea and vomiting -pain, swelling, redness or irritation at the injection site -pain, tingling, numbness in the hands or feet -problems with balance, talking, walking -trouble passing urine or change in the amount of urine Side effects that usually do not require medical attention (report to your doctor or health care professional if they continue or are bothersome): -hair loss -loss of appetite -metallic taste in the mouth or changes in taste This list may not describe all possible side effects. Call your doctor for medical advice about side effects. You may report side effects to FDA at 1-800-FDA-1088. Where should I keep my medicine? This drug is given in a hospital or clinic and will not be stored at home. NOTE: This sheet is a summary. It may not cover all possible information. If you have questions about this medicine, talk to your doctor, pharmacist, or health care  provider.  2018 Elsevier/Gold Standard (2008-01-19 14:38:05)

## 2017-04-09 ENCOUNTER — Telehealth: Payer: Self-pay | Admitting: *Deleted

## 2017-04-09 NOTE — Telephone Encounter (Signed)
-----   Message from Thomasene Lot, RN sent at 04/07/2017  5:57 PM EDT ----- Regarding: Dr Burr Medico 1st tx f/u call 1st tx f/u call

## 2017-04-09 NOTE — Telephone Encounter (Signed)
Called pt to discuss how she did post chemo & she reports that she is having some trouble with her feet/ankles swelling today.  She reports that she has been up a lot today on her feet.  Instructed to drink plenty of fluids & to elevate feet above heart & if swelling doesn't got down or gets worse, let us know.  She states that she hasn't had a BM today but doesn't feel constipated.  No further problems.  Pt knows to call if symptoms worse.

## 2017-04-10 ENCOUNTER — Telehealth: Payer: Self-pay | Admitting: Hematology

## 2017-04-10 NOTE — Telephone Encounter (Signed)
Patient bypassed scheduling on 04/07/17.  Appointments scheduled and confirmed with patient, per 04/07/17 los.

## 2017-04-15 ENCOUNTER — Telehealth: Payer: Self-pay | Admitting: *Deleted

## 2017-04-15 ENCOUNTER — Ambulatory Visit (HOSPITAL_BASED_OUTPATIENT_CLINIC_OR_DEPARTMENT_OTHER): Payer: Medicare Other | Admitting: Nurse Practitioner

## 2017-04-15 ENCOUNTER — Ambulatory Visit (HOSPITAL_COMMUNITY)
Admission: RE | Admit: 2017-04-15 | Discharge: 2017-04-15 | Disposition: A | Discharge status: Home / Self Care | Payer: Medicare Other | Source: Ambulatory Visit | Attending: Nurse Practitioner | Admitting: Nurse Practitioner

## 2017-04-15 ENCOUNTER — Other Ambulatory Visit (HOSPITAL_BASED_OUTPATIENT_CLINIC_OR_DEPARTMENT_OTHER): Payer: Medicare Other

## 2017-04-15 ENCOUNTER — Encounter: Payer: Self-pay | Admitting: Nurse Practitioner

## 2017-04-15 VITALS — BP 145/75 | HR 96 | Temp 98.1°F | Resp 18 | Ht 59.5 in | Wt 101.2 lb

## 2017-04-15 DIAGNOSIS — C3490 Malignant neoplasm of unspecified part of unspecified bronchus or lung: Secondary | ICD-10-CM

## 2017-04-15 DIAGNOSIS — I82402 Acute embolism and thrombosis of unspecified deep veins of left lower extremity: Secondary | ICD-10-CM

## 2017-04-15 DIAGNOSIS — I82492 Acute embolism and thrombosis of other specified deep vein of left lower extremity: Secondary | ICD-10-CM | POA: Diagnosis not present

## 2017-04-15 DIAGNOSIS — C7951 Secondary malignant neoplasm of bone: Secondary | ICD-10-CM | POA: Diagnosis not present

## 2017-04-15 DIAGNOSIS — I82502 Chronic embolism and thrombosis of unspecified deep veins of left lower extremity: Secondary | ICD-10-CM

## 2017-04-15 DIAGNOSIS — R634 Abnormal weight loss: Secondary | ICD-10-CM | POA: Diagnosis not present

## 2017-04-15 DIAGNOSIS — I82512 Chronic embolism and thrombosis of left femoral vein: Secondary | ICD-10-CM | POA: Diagnosis not present

## 2017-04-15 DIAGNOSIS — I82532 Chronic embolism and thrombosis of left popliteal vein: Secondary | ICD-10-CM | POA: Diagnosis not present

## 2017-04-15 DIAGNOSIS — D696 Thrombocytopenia, unspecified: Secondary | ICD-10-CM

## 2017-04-15 DIAGNOSIS — C7931 Secondary malignant neoplasm of brain: Secondary | ICD-10-CM

## 2017-04-15 DIAGNOSIS — C779 Secondary and unspecified malignant neoplasm of lymph node, unspecified: Secondary | ICD-10-CM

## 2017-04-15 DIAGNOSIS — R600 Localized edema: Secondary | ICD-10-CM | POA: Diagnosis present

## 2017-04-15 DIAGNOSIS — R630 Anorexia: Secondary | ICD-10-CM | POA: Diagnosis not present

## 2017-04-15 HISTORY — DX: Acute embolism and thrombosis of unspecified deep veins of left lower extremity: I82.402

## 2017-04-15 LAB — COMPREHENSIVE METABOLIC PANEL
ALBUMIN: 3.1 g/dL — AB (ref 3.5–5.0)
ALK PHOS: 119 U/L (ref 40–150)
ALT: 38 U/L (ref 0–55)
AST: 26 U/L (ref 5–34)
Anion Gap: 11 mEq/L (ref 3–11)
BUN: 13.4 mg/dL (ref 7.0–26.0)
CALCIUM: 8.9 mg/dL (ref 8.4–10.4)
CO2: 25 mEq/L (ref 22–29)
Chloride: 103 mEq/L (ref 98–109)
Creatinine: 0.7 mg/dL (ref 0.6–1.1)
Glucose: 163 mg/dl — ABNORMAL HIGH (ref 70–140)
POTASSIUM: 4.3 meq/L (ref 3.5–5.1)
Sodium: 139 mEq/L (ref 136–145)
Total Bilirubin: 0.26 mg/dL (ref 0.20–1.20)
Total Protein: 6.4 g/dL (ref 6.4–8.3)

## 2017-04-15 LAB — CBC WITH DIFFERENTIAL/PLATELET
BASO%: 0.1 % (ref 0.0–2.0)
Basophils Absolute: 0 10*3/uL (ref 0.0–0.1)
EOS%: 0.2 % (ref 0.0–7.0)
Eosinophils Absolute: 0 10*3/uL (ref 0.0–0.5)
HEMATOCRIT: 32.2 % — AB (ref 34.8–46.6)
HEMOGLOBIN: 10.4 g/dL — AB (ref 11.6–15.9)
LYMPH#: 1.3 10*3/uL (ref 0.9–3.3)
LYMPH%: 9.5 % — ABNORMAL LOW (ref 14.0–49.7)
MCH: 26.4 pg (ref 25.1–34.0)
MCHC: 32.3 g/dL (ref 31.5–36.0)
MCV: 81.7 fL (ref 79.5–101.0)
MONO#: 1.1 10*3/uL — AB (ref 0.1–0.9)
MONO%: 8.4 % (ref 0.0–14.0)
NEUT#: 11.1 10*3/uL — ABNORMAL HIGH (ref 1.5–6.5)
NEUT%: 81.8 % — AB (ref 38.4–76.8)
NRBC: 0 % (ref 0–0)
Platelets: 117 10*3/uL — ABNORMAL LOW (ref 145–400)
RBC: 3.94 10*6/uL (ref 3.70–5.45)
RDW: 17.2 % — ABNORMAL HIGH (ref 11.2–14.5)
WBC: 13.5 10*3/uL — ABNORMAL HIGH (ref 3.9–10.3)

## 2017-04-15 MED ORDER — RIVAROXABAN (XARELTO) VTE STARTER PACK (15 & 20 MG): ORAL_TABLET | ORAL | 0 refills | Status: DC

## 2017-04-15 MED FILL — XARELTO STARTER PACK: 15 & 20 | 30 days supply | Qty: 51 | Fill #0

## 2017-04-15 NOTE — Progress Notes (Signed)
  [facility] OFFICE PROGRESS NOTE   Diagnosis:  Metastatic lung cancer  INTERVAL HISTORY:   Penny Stewart returns as scheduled. She completed cycle 1 carboplatin/Alimta/Avastin 04/07/2017. She denies nausea/vomiting. No mouth sores. No diarrhea. She did have some constipation which has since resolved. She noted a small amount of blood on the toilet tissue after one bowel movement. No other bleeding. She denies shortness of breath and chest pain. She has noted left leg swelling since last week.  Objective:  Vital signs in last 24 hours:  Blood pressure (!) 145/75, pulse 96, temperature 98.1 F (36.7 C), resp. rate 18, height 4' 11.5" (1.511 m), weight 101 lb 3.2 oz (45.9 kg), SpO2 96 %.    HEENT: No thrush or ulcers. Resp: Distant breath sounds. Cardio: Regular rate and rhythm. GI: Abdomen soft and nontender. No hepatomegaly. Vascular: Left leg edema below the knee.    Lab Results:  Lab Results  Component Value Date   WBC 13.5 (H) 04/15/2017   HGB 10.4 (L) 04/15/2017   HCT 32.2 (L) 04/15/2017   MCV 81.7 04/15/2017   PLT 117 (L) 04/15/2017   NEUTROABS 11.1 (H) 04/15/2017    Imaging:  No results found.  Medications: I have reviewed the patient's current medications.  Assessment/Plan: 1. Metastatic lung cancer involving lymph nodes, bone, brain and lung, PD-L1 negative; status post whole brain radiation 03/17/2017 through 03/31/2017. Status post cycle 1 carboplatin/Alimta/Avastin 04/07/2017. Carboplatin dose reduced to AUC 4 due to advanced age and performance status. 2. Anorexia/weight loss. Taking Remeron. 3. Thrombocytopenia. Platelet count declined to 120,000 on 04/07/2017. As noted above carboplatin dose reduced. The count stable 04/15/2017. 4. Left leg edema. Acute and chronic DVT left leg.   Disposition: Penny Stewart appears stable. She completed cycle 1 carboplatin/Alimta/Avastin 04/07/2017. Overall she seems to have tolerated the treatment  well.  She is noted to have left leg edema on exam today. We are referring her for a venous Doppler to rule out DVT. If positive for DVT she understands to return to the office to discuss anticoagulation.  Her next scheduled visit is 04/28/2017.  Ned Card ANP/GNP-BC   04/15/2017  11:05 AM  Addendum 2:40 PM--venous Doppler confirmed acute and chronic DVT involving the left lower extremity. We reviewed the different choices for anticoagulation. We decided on Xarelto. We discussed the bleeding risk. She understands a reversal agent is currently not available. She is agreeable to proceed. The Xarelto starter pack was prescribed. She will follow-up as scheduled.  Above plan reviewed with Dr. Benay Spice.  25 minutes were spent face-to-face at today's visit with the majority of that time involved in counseling/coordination of care.

## 2017-04-15 NOTE — Telephone Encounter (Signed)
Patient directed to return to [facility] for 1pm doppler of left leg to rule out DVT. Patient verbalized an understanding, stating she would go get her schedule taken care of and grab lunch then return to the hospital.

## 2017-04-15 NOTE — Progress Notes (Signed)
**  Preliminary report by tech**  Left lower extremity venous duplex complete. There is evidence of chronic deep vein thrombosis involving the common femoral, femoral, and popliteal veins of the left lower extremity. There is also evidence of acute deep vein thrombosis involving the peroneal veins of the left lower extremity. The right and left common iliac, and external iliac veins were observed and appear to be patent. There is no evidence of superficial vein thrombosis involving the left lower extremity. There is no evidence of a Baker's cyst on the left. Results were given to Clarise Cruz at the cancer center.  04/15/17 1:50 PM Carlos Levering RVT

## 2017-04-28 ENCOUNTER — Ambulatory Visit (HOSPITAL_BASED_OUTPATIENT_CLINIC_OR_DEPARTMENT_OTHER): Payer: Medicare Other | Admitting: Nurse Practitioner

## 2017-04-28 ENCOUNTER — Other Ambulatory Visit (HOSPITAL_BASED_OUTPATIENT_CLINIC_OR_DEPARTMENT_OTHER): Payer: Medicare Other

## 2017-04-28 ENCOUNTER — Ambulatory Visit (HOSPITAL_BASED_OUTPATIENT_CLINIC_OR_DEPARTMENT_OTHER): Payer: Medicare Other

## 2017-04-28 ENCOUNTER — Telehealth: Payer: Self-pay | Admitting: Nurse Practitioner

## 2017-04-28 VITALS — BP 147/72

## 2017-04-28 VITALS — BP 149/82 | HR 103 | Temp 97.5°F | Resp 18 | Ht 59.5 in | Wt 96.4 lb

## 2017-04-28 DIAGNOSIS — Z5112 Encounter for antineoplastic immunotherapy: Secondary | ICD-10-CM

## 2017-04-28 DIAGNOSIS — C7951 Secondary malignant neoplasm of bone: Secondary | ICD-10-CM

## 2017-04-28 DIAGNOSIS — I82402 Acute embolism and thrombosis of unspecified deep veins of left lower extremity: Secondary | ICD-10-CM

## 2017-04-28 DIAGNOSIS — C779 Secondary and unspecified malignant neoplasm of lymph node, unspecified: Secondary | ICD-10-CM

## 2017-04-28 DIAGNOSIS — C7931 Secondary malignant neoplasm of brain: Secondary | ICD-10-CM

## 2017-04-28 DIAGNOSIS — C3490 Malignant neoplasm of unspecified part of unspecified bronchus or lung: Secondary | ICD-10-CM

## 2017-04-28 LAB — CBC WITH DIFFERENTIAL/PLATELET
BASO%: 0.1 % (ref 0.0–2.0)
Basophils Absolute: 0 10*3/uL (ref 0.0–0.1)
EOS%: 0 % (ref 0.0–7.0)
Eosinophils Absolute: 0 10*3/uL (ref 0.0–0.5)
HEMATOCRIT: 36.7 % (ref 34.8–46.6)
HEMOGLOBIN: 11.9 g/dL (ref 11.6–15.9)
LYMPH#: 1.4 10*3/uL (ref 0.9–3.3)
LYMPH%: 5.6 % — ABNORMAL LOW (ref 14.0–49.7)
MCH: 26.7 pg (ref 25.1–34.0)
MCHC: 32.4 g/dL (ref 31.5–36.0)
MCV: 82.3 fL (ref 79.5–101.0)
MONO#: 2.1 10*3/uL — ABNORMAL HIGH (ref 0.1–0.9)
MONO%: 8.5 % (ref 0.0–14.0)
NEUT#: 20.8 10*3/uL — ABNORMAL HIGH (ref 1.5–6.5)
NEUT%: 85.8 % — ABNORMAL HIGH (ref 38.4–76.8)
Platelets: 472 10*3/uL — ABNORMAL HIGH (ref 145–400)
RBC: 4.46 10*6/uL (ref 3.70–5.45)
RDW: 18.1 % — AB (ref 11.2–14.5)
WBC: 24.3 10*3/uL — ABNORMAL HIGH (ref 3.9–10.3)

## 2017-04-28 LAB — COMPREHENSIVE METABOLIC PANEL
ALBUMIN: 3.4 g/dL — AB (ref 3.5–5.0)
ALK PHOS: 110 U/L (ref 40–150)
ALT: 43 U/L (ref 0–55)
AST: 19 U/L (ref 5–34)
Anion Gap: 10 mEq/L (ref 3–11)
BILIRUBIN TOTAL: 0.22 mg/dL (ref 0.20–1.20)
BUN: 13.9 mg/dL (ref 7.0–26.0)
CO2: 30 mEq/L — ABNORMAL HIGH (ref 22–29)
CREATININE: 0.7 mg/dL (ref 0.6–1.1)
Calcium: 9.5 mg/dL (ref 8.4–10.4)
Chloride: 101 mEq/L (ref 98–109)
EGFR: >90 mL/min/{1.73_m2} (ref >90)
Glucose: 157 mg/dl — ABNORMAL HIGH (ref 70–140)
Potassium: 3.8 mEq/L (ref 3.5–5.1)
Sodium: 141 mEq/L (ref 136–145)
TOTAL PROTEIN: 6.7 g/dL (ref 6.4–8.3)

## 2017-04-28 LAB — TECHNOLOGIST REVIEW

## 2017-04-28 MED ORDER — SODIUM CHLORIDE 0.9 % IV SOLN
500.0000 mg/m2 | Freq: Once | INTRAVENOUS | Status: AC
  Administered 2017-04-28: 700 mg via INTRAVENOUS
  Filled 2017-04-28: qty 20

## 2017-04-28 MED ORDER — RIVAROXABAN 20 MG PO TABS: 20.0000 mg | ORAL_TABLET | Freq: Every day | ORAL | 2 refills | Status: DC

## 2017-04-28 MED ORDER — SODIUM CHLORIDE 0.9 % IV SOLN
251.2000 mg | Freq: Once | INTRAVENOUS | Status: AC
  Administered 2017-04-28: 250 mg via INTRAVENOUS
  Filled 2017-04-28: qty 25

## 2017-04-28 MED ORDER — SODIUM CHLORIDE 0.9 % IV SOLN
Freq: Once | INTRAVENOUS | Status: AC
  Administered 2017-04-28: 14:00:00 via INTRAVENOUS

## 2017-04-28 MED ORDER — PALONOSETRON HCL INJECTION 0.25 MG/5ML
0.2500 mg | Freq: Once | INTRAVENOUS | Status: AC
  Administered 2017-04-28: 0.25 mg via INTRAVENOUS

## 2017-04-28 MED ORDER — SODIUM CHLORIDE 0.9 % IV SOLN
15.5000 mg/kg | Freq: Once | INTRAVENOUS | Status: AC
  Administered 2017-04-28: 700 mg via INTRAVENOUS
  Filled 2017-04-28: qty 16

## 2017-04-28 MED ORDER — SODIUM CHLORIDE 0.9 % IV SOLN
Freq: Once | INTRAVENOUS | Status: AC
  Administered 2017-04-28: 14:00:00 via INTRAVENOUS
  Filled 2017-04-28: qty 5

## 2017-04-28 MED ORDER — PALONOSETRON HCL INJECTION 0.25 MG/5ML
INTRAVENOUS | Status: AC
  Filled 2017-04-28: qty 5

## 2017-04-28 NOTE — Progress Notes (Signed)
  Penny Stewart   Diagnosis: Metastatic lung cancer   INTERVAL HISTORY:   Penny Stewart returns as scheduled. She completed cycle 1 carboplatin/Alimta/Avastin 04/07/2017. She denies nausea/vomiting. No mouth sores. No diarrhea. No bleeding. She denies pain. Appetite is better. She notes improvement in left leg edema. No shortness of breath. No chest pain.  Objective:  Vital signs in last 24 hours:  Blood pressure (!) 149/82, pulse (!) 103, temperature 97.5 F (36.4 C), temperature source Oral, resp. rate 18, height 4' 11.5" (1.511 m), weight 96 lb 6.4 oz (43.7 kg), SpO2 94 %.    HEENT: No thrush or ulcers. Lymphatics: 1 cm right axillary lymph node. Several left axillary lymph nodes, largest measuring 2-3 cm. 3 cm left inguinal lymph node. Resp: Lungs clear bilaterally. Cardio: Regular rate and rhythm. GI: Abdomen soft and nontender. No hepatosplenomegaly. Vascular: Trace edema left lower leg/ankle.   Lab Results:  Lab Results  Component Value Date   WBC 24.3 (H) 04/28/2017   HGB 11.9 04/28/2017   HCT 36.7 04/28/2017   MCV 82.3 04/28/2017   PLT 472 (H) 04/28/2017   NEUTROABS 20.8 (H) 04/28/2017    Imaging:  No results found.  Medications: I have reviewed the patient's current medications.  Assessment/Plan: 1. Metastatic lung cancer involving lymph nodes, bone, brain and lung, PD-L1 negative; status post whole brain radiation 03/17/2017 through 03/31/2017. Status post cycle 1 carboplatin/Alimta/Avastin 04/07/2017. Carboplatin dose reduced to AUC 4 due to advanced age and performance status. 2. Anorexia/weight loss. Taking Remeron. 3. Thrombocytopenia. Platelet count declined to 120,000 on 04/07/2017. As noted above carboplatin dose reduced. Platelet count stable 04/15/2017. 4. Left leg edema 04/07/2017 . Acute and chronic DVT left leg. On Xarelto.   Disposition: Penny Stewart appears stable. She has completed 1 cycle of  carboplatin/Alimta/Avastin. Plan to proceed with cycle 2 today as scheduled. She will return for a follow-up visit and cycle 3 in 3 weeks. The plan is to refer her for restaging CTs after she has completed 3 cycles.  Plan reviewed with Penny Stewart.   Ned Card ANP/GNP-BC   04/28/2017  12:32 PM

## 2017-04-28 NOTE — Telephone Encounter (Signed)
Scheduled appt per 7/2 los - Gave patient AVS and calender per los.

## 2017-04-28 NOTE — Patient Instructions (Signed)
[facility] Discharge Instructions for Patients Receiving Chemotherapy  Today you received the following chemotherapy agents: Avastin, Alimta, Carboplatin    To help prevent nausea and vomiting after your treatment, we encourage you to take your nausea medication as prescribed.  If you develop nausea and vomiting that is not controlled by your nausea medication, call the clinic.   BELOW ARE SYMPTOMS THAT SHOULD BE REPORTED IMMEDIATELY:  *FEVER GREATER THAN 100.5 F  *CHILLS WITH OR WITHOUT FEVER  NAUSEA AND VOMITING THAT IS NOT CONTROLLED WITH YOUR NAUSEA MEDICATION  *UNUSUAL SHORTNESS OF BREATH  *UNUSUAL BRUISING OR BLEEDING  TENDERNESS IN MOUTH AND THROAT WITH OR WITHOUT PRESENCE OF ULCERS  *URINARY PROBLEMS  *BOWEL PROBLEMS  UNUSUAL RASH Items with * indicate a potential emergency and should be followed up as soon as possible.  Feel free to call the clinic you have any questions or concerns. The clinic phone number is [phone].  Please show the [facility] at check-in to the Emergency Department and triage nurse.

## 2017-04-29 ENCOUNTER — Encounter: Payer: Self-pay | Admitting: Radiation Oncology

## 2017-04-29 ENCOUNTER — Ambulatory Visit
Admission: RE | Admit: 2017-04-29 | Discharge: 2017-04-29 | Disposition: A | Discharge status: Home / Self Care | Payer: Medicare Other | Source: Ambulatory Visit | Attending: Radiation Oncology | Admitting: Radiation Oncology

## 2017-04-29 VITALS — BP 158/74 | HR 100 | Temp 98.1°F | Resp 18 | Ht 59.5 in | Wt 97.2 lb

## 2017-04-29 DIAGNOSIS — Z923 Personal history of irradiation: Secondary | ICD-10-CM | POA: Insufficient documentation

## 2017-04-29 DIAGNOSIS — Z7901 Long term (current) use of anticoagulants: Secondary | ICD-10-CM | POA: Insufficient documentation

## 2017-04-29 DIAGNOSIS — C778 Secondary and unspecified malignant neoplasm of lymph nodes of multiple regions: Secondary | ICD-10-CM | POA: Diagnosis not present

## 2017-04-29 DIAGNOSIS — C3491 Malignant neoplasm of unspecified part of right bronchus or lung: Secondary | ICD-10-CM | POA: Insufficient documentation

## 2017-04-29 DIAGNOSIS — I10 Essential (primary) hypertension: Secondary | ICD-10-CM | POA: Diagnosis not present

## 2017-04-29 DIAGNOSIS — Z79899 Other long term (current) drug therapy: Secondary | ICD-10-CM | POA: Diagnosis not present

## 2017-04-29 DIAGNOSIS — C7951 Secondary malignant neoplasm of bone: Secondary | ICD-10-CM | POA: Insufficient documentation

## 2017-04-29 DIAGNOSIS — C3490 Malignant neoplasm of unspecified part of unspecified bronchus or lung: Secondary | ICD-10-CM

## 2017-04-29 DIAGNOSIS — C7931 Secondary malignant neoplasm of brain: Secondary | ICD-10-CM | POA: Diagnosis not present

## 2017-04-29 MED ORDER — DEXAMETHASONE 2 MG PO TABS: ORAL_TABLET | ORAL | 0 refills | Status: DC

## 2017-04-30 NOTE — Progress Notes (Signed)
Radiation Oncology         [phone] ________________________________  Name: Penny Stewart MRN: [id number]  Date: 04/29/2017  DOB: [birth year]  Post Treatment Note  CC: Foye Spurling, MD  Truitt Merle, MD  Diagnosis:   Stage IV, [phone] NSCLC, adenocarcinoma of either right upper lobe versus left lower lobe with disease to the retroperitoneal, axillary, and inguinal nodes, right iliac bone, and brain  Interval Since Last Radiation:  4 weeks   03/17/17 - 03/31/17: The whole brain was treated to 30 Gy in 10 fractions of 3 Gy  Narrative:  The patient returns today for routine follow-up. She originally presented with speech changes and confusion and was put on dexamethasone after her Brain MRI revealed multiple lesions. She subsequently received whole brain radiation and has been tapering her dexamethasone. She is currently taking 4mg  daily. She is tolerating chemotherapy well with Dr. Burr Medico as well.                              On review of systems, the patient states she feels quite well and reports her speech is much improved since our first visit. Her husband agrees that she is more alert and has not had any new neurologic symptoms since making adjustments in her dexamethasone. No other complaints are noted.  ALLERGIES:  is allergic to no known allergies.  Meds: Current Outpatient Prescriptions  Medication Sig Dispense Refill  . amLODipine (NORVASC) 10 MG tablet Take 10 mg by mouth daily at 12 noon.     [name] atorvastatin (LIPITOR) 20 MG tablet Take 20 mg by mouth at bedtime.     [name] dexamethasone (DECADRON) 2 MG tablet Starting 05/04/17 take one 2mg  tablet po daily.  Starting 2017/06/09 take one 2mg  tablet po every other day.  Stop on 05/19/17. 30 tablet 0  . folic acid (FOLVITE) 1 MG tablet Take 1 tablet (1 mg total) by mouth daily. 60 tablet 2  . levothyroxine (SYNTHROID, LEVOTHROID) 75 MCG tablet Take 75 mcg by mouth daily before breakfast.    . mirtazapine (REMERON) 30 MG tablet TK 1 T PO  QD  1  . rivaroxaban (XARELTO) 20 MG TABS tablet Take 1 tablet (20 mg total) by mouth daily with supper. Begin after you complete the starter pack. 30 tablet 2  . Rivaroxaban 15 & 20 MG TBPK Take as directed on package: Start with one 15mg  tablet by mouth twice a day with food. On Day 22, switch to one 20mg  tablet once a day with food. 51 each 0  . vitamin B-12 (CYANOCOBALAMIN) 1000 MCG tablet Take 1,000 mcg by mouth daily.    [name] HYDROcodone-acetaminophen (NORCO) 5-325 MG tablet Take 1-2 tablets by mouth every 6 (six) hours as needed for moderate pain or severe pain. (Patient not taking: Reported on 04/29/2017) 30 tablet 0  . ondansetron (ZOFRAN) 8 MG tablet Take 1 tablet (8 mg total) by mouth 2 (two) times daily as needed for refractory nausea / vomiting. Start on day 3 after chemo. (Patient not taking: Reported on 04/15/2017) 30 tablet 1  . prochlorperazine (COMPAZINE) 10 MG tablet Take 1 tablet (10 mg total) by mouth every 6 (six) hours as needed (Nausea or vomiting). (Patient not taking: Reported on 04/15/2017) 30 tablet 1   No current facility-administered medications for this encounter.     Physical Findings:  height is 4' 11.5" (1.511 m) and weight is 97 lb 3.2 oz (44.1 kg).  Her oral temperature is 98.1 F (36.7 C). Her blood pressure is 158/74 (abnormal) and her pulse is 100. Her respiration is 18 and oxygen saturation is 95%.  Pain Assessment Pain Score: 0-No pain/10 In general this is a well appearing African American female in no acute distress. She's alert and oriented x4 and appropriate throughout the examination. Cardiopulmonary assessment is negative for acute distress and she exhibits normal effort.   Lab Findings: Lab Results  Component Value Date   WBC 24.3 (H) 04/28/2017   HGB 11.9 04/28/2017   HCT 36.7 04/28/2017   MCV 82.3 04/28/2017   PLT 472 (H) 04/28/2017     Radiographic Findings: No results found.  Impression/Plan: 1. Stage IV, [id number] NSCLC, adenocarcinoma of  either right upper lobe versus left lower lobe with disease to the retroperitoneal, axillary, and inguinal nodes, right iliac bone, and brain. The patient continues to do well in tolerating her chemotherapy regimen with Dr. Burr Medico. We will plan to follow her closely for her brain disease in our multidisciplinary brain conference. She will be due for her first MRI of the brain in September. She will be set up for this and contacted, and continue her systemic therapy with Dr. Burr Medico. 2. Steroid Instructions. The patient has still been taking 4mg  of Dexamethasone daily, but did have significant edema on her initial brain MRI. We will plan to taper, and I've instructed her to take her remaining 4 mg tablets and cut them in half so that she's taking 2 mg daily for 1 week. A new prescription will be called in for 2 mg tablets to complete her taper. Following her 1 week at 2mg  daily, she will taper to 2 mg QOD for 1 1/2 weeks, then discontinue. She's advised to call us if she develops any neurologic or speech changes as he had prior to her treatment. 3. Hypertension. The patient will follow up with her PCP regarding this and we will follow this expectantly. She will proceed to acute care evaluation if she becomes symptomatic.   Carola Rhine, PAC

## 2017-05-05 ENCOUNTER — Telehealth: Payer: Self-pay | Admitting: *Deleted

## 2017-05-05 NOTE — Telephone Encounter (Signed)
Received message from 05/04/17 from on-call [facility] RN stating that pt hasn't had a BM in 3 days & is feeling extremely weak.  She had chemo on last Monday.   Called & spoke to husband & he states they tried MOM yest but pt threw it up.  He states that she is settled now & will try again.  She reports passing some gas & not eating much since yest.  Instructed if MOM doesn't help to try Mag Citrate & to touch base with [place] to let us know how she is doing.

## 2017-05-06 ENCOUNTER — Encounter (HOSPITAL_COMMUNITY): Payer: Self-pay

## 2017-05-06 ENCOUNTER — Emergency Department (HOSPITAL_COMMUNITY): Payer: Medicare Other

## 2017-05-06 ENCOUNTER — Inpatient Hospital Stay (HOSPITAL_COMMUNITY)
Admission: EM | Admit: 2017-05-06 | Discharge: 2017-05-09 | DRG: 871 | Disposition: A | Discharge status: Hospice | Payer: Medicare Other | Attending: Internal Medicine | Admitting: Internal Medicine

## 2017-05-06 DIAGNOSIS — E860 Dehydration: Secondary | ICD-10-CM | POA: Diagnosis present

## 2017-05-06 DIAGNOSIS — K631 Perforation of intestine (nontraumatic): Secondary | ICD-10-CM | POA: Diagnosis present

## 2017-05-06 DIAGNOSIS — F329 Major depressive disorder, single episode, unspecified: Secondary | ICD-10-CM | POA: Diagnosis present

## 2017-05-06 DIAGNOSIS — E162 Hypoglycemia, unspecified: Secondary | ICD-10-CM | POA: Diagnosis not present

## 2017-05-06 DIAGNOSIS — Z681 Body mass index (BMI) 19 or less, adult: Secondary | ICD-10-CM

## 2017-05-06 DIAGNOSIS — Z515 Encounter for palliative care: Secondary | ICD-10-CM | POA: Diagnosis not present

## 2017-05-06 DIAGNOSIS — A419 Sepsis, unspecified organism: Secondary | ICD-10-CM | POA: Diagnosis not present

## 2017-05-06 DIAGNOSIS — K56609 Unspecified intestinal obstruction, unspecified as to partial versus complete obstruction: Secondary | ICD-10-CM | POA: Diagnosis present

## 2017-05-06 DIAGNOSIS — I10 Essential (primary) hypertension: Secondary | ICD-10-CM | POA: Diagnosis present

## 2017-05-06 DIAGNOSIS — Z800 Family history of malignant neoplasm of digestive organs: Secondary | ICD-10-CM

## 2017-05-06 DIAGNOSIS — E876 Hypokalemia: Secondary | ICD-10-CM | POA: Diagnosis not present

## 2017-05-06 DIAGNOSIS — Z7901 Long term (current) use of anticoagulants: Secondary | ICD-10-CM

## 2017-05-06 DIAGNOSIS — T451X5A Adverse effect of antineoplastic and immunosuppressive drugs, initial encounter: Secondary | ICD-10-CM | POA: Diagnosis present

## 2017-05-06 DIAGNOSIS — N179 Acute kidney failure, unspecified: Secondary | ICD-10-CM | POA: Diagnosis present

## 2017-05-06 DIAGNOSIS — Z0189 Encounter for other specified special examinations: Secondary | ICD-10-CM

## 2017-05-06 DIAGNOSIS — E039 Hypothyroidism, unspecified: Secondary | ICD-10-CM | POA: Diagnosis present

## 2017-05-06 DIAGNOSIS — R64 Cachexia: Secondary | ICD-10-CM | POA: Diagnosis present

## 2017-05-06 DIAGNOSIS — C7931 Secondary malignant neoplasm of brain: Secondary | ICD-10-CM | POA: Diagnosis present

## 2017-05-06 DIAGNOSIS — E43 Unspecified severe protein-calorie malnutrition: Secondary | ICD-10-CM | POA: Diagnosis present

## 2017-05-06 DIAGNOSIS — R198 Other specified symptoms and signs involving the digestive system and abdomen: Secondary | ICD-10-CM | POA: Diagnosis present

## 2017-05-06 DIAGNOSIS — Z803 Family history of malignant neoplasm of breast: Secondary | ICD-10-CM

## 2017-05-06 DIAGNOSIS — Z4659 Encounter for fitting and adjustment of other gastrointestinal appliance and device: Secondary | ICD-10-CM

## 2017-05-06 DIAGNOSIS — I82402 Acute embolism and thrombosis of unspecified deep veins of left lower extremity: Secondary | ICD-10-CM | POA: Diagnosis present

## 2017-05-06 DIAGNOSIS — Z87891 Personal history of nicotine dependence: Secondary | ICD-10-CM

## 2017-05-06 DIAGNOSIS — D61810 Antineoplastic chemotherapy induced pancytopenia: Secondary | ICD-10-CM | POA: Diagnosis present

## 2017-05-06 DIAGNOSIS — C3490 Malignant neoplasm of unspecified part of unspecified bronchus or lung: Secondary | ICD-10-CM | POA: Diagnosis present

## 2017-05-06 DIAGNOSIS — E785 Hyperlipidemia, unspecified: Secondary | ICD-10-CM | POA: Diagnosis present

## 2017-05-06 DIAGNOSIS — Z923 Personal history of irradiation: Secondary | ICD-10-CM

## 2017-05-06 DIAGNOSIS — I959 Hypotension, unspecified: Secondary | ICD-10-CM | POA: Diagnosis present

## 2017-05-06 DIAGNOSIS — Z66 Do not resuscitate: Secondary | ICD-10-CM | POA: Diagnosis present

## 2017-05-06 DIAGNOSIS — K56600 Partial intestinal obstruction, unspecified as to cause: Secondary | ICD-10-CM | POA: Diagnosis present

## 2017-05-06 DIAGNOSIS — C786 Secondary malignant neoplasm of retroperitoneum and peritoneum: Secondary | ICD-10-CM | POA: Diagnosis present

## 2017-05-06 LAB — COMPREHENSIVE METABOLIC PANEL
ALBUMIN: 2.6 g/dL — AB (ref 3.5–5.0)
ALT: 29 U/L (ref 14–54)
ANION GAP: 12 (ref 5–15)
AST: 50 U/L — ABNORMAL HIGH (ref 15–41)
Alkaline Phosphatase: 91 U/L (ref 38–126)
BUN: 81 mg/dL — ABNORMAL HIGH (ref 6–20)
CHLORIDE: 91 mmol/L — AB (ref 101–111)
CO2: 33 mmol/L — AB (ref 22–32)
Calcium: 7.6 mg/dL — ABNORMAL LOW (ref 8.9–10.3)
Creatinine, Ser: 2.4 mg/dL — ABNORMAL HIGH (ref 0.44–1.00)
GFR calc non Af Amer: 19 mL/min — ABNORMAL LOW (ref >60)
GFR, EST AFRICAN AMERICAN: 22 mL/min — AB (ref >60)
GLUCOSE: 146 mg/dL — AB (ref 65–99)
POTASSIUM: 4.5 mmol/L (ref 3.5–5.1)
SODIUM: 136 mmol/L (ref 135–145)
Total Bilirubin: 0.3 mg/dL (ref 0.3–1.2)
Total Protein: 6.5 g/dL (ref 6.5–8.1)

## 2017-05-06 LAB — CBC WITH DIFFERENTIAL/PLATELET
BASOS ABS: 0 10*3/uL (ref 0.0–0.1)
BASOS PCT: 1 %
Eosinophils Absolute: 0 10*3/uL (ref 0.0–0.7)
Eosinophils Relative: 1 %
HCT: 34.1 % — ABNORMAL LOW (ref 36.0–46.0)
HEMOGLOBIN: 11.2 g/dL — AB (ref 12.0–15.0)
LYMPHS PCT: 14 %
Lymphs Abs: 0.3 10*3/uL — ABNORMAL LOW (ref 0.7–4.0)
MCH: 25.9 pg — AB (ref 26.0–34.0)
MCHC: 32.8 g/dL (ref 30.0–36.0)
MCV: 78.8 fL (ref 78.0–100.0)
MONOS PCT: 1 %
Monocytes Absolute: 0 10*3/uL — ABNORMAL LOW (ref 0.1–1.0)
NEUTROS ABS: 1.7 10*3/uL (ref 1.7–7.7)
NEUTROS PCT: 83 %
Platelets: 132 10*3/uL — ABNORMAL LOW (ref 150–400)
RBC: 4.33 MIL/uL (ref 3.87–5.11)
RDW: 19.6 % — ABNORMAL HIGH (ref 11.5–15.5)
WBC: 2 10*3/uL — ABNORMAL LOW (ref 4.0–10.5)

## 2017-05-06 LAB — I-STAT CG4 LACTIC ACID, ED: LACTIC ACID, VENOUS: 2.65 mmol/L — AB (ref 0.5–1.9)

## 2017-05-06 LAB — LIPASE, BLOOD

## 2017-05-06 MED ORDER — PIPERACILLIN-TAZOBACTAM 3.375 G IVPB 30 MIN
3.3750 g | Freq: Once | INTRAVENOUS | Status: AC
  Administered 2017-05-07: 3.375 g via INTRAVENOUS
  Filled 2017-05-06: qty 50

## 2017-05-06 MED ORDER — IOPAMIDOL (ISOVUE-300) INJECTION 61%
INTRAVENOUS | Status: AC
  Filled 2017-05-06: qty 30

## 2017-05-06 MED ORDER — IOPAMIDOL (ISOVUE-300) INJECTION 61%
30.0000 mL | Freq: Once | INTRAVENOUS | Status: DC | PRN
Start: 2017-05-06 — End: 2017-05-09
  Administered 2017-05-06: 30 mL via ORAL
  Filled 2017-05-06: qty 30

## 2017-05-06 MED ORDER — ONDANSETRON HCL 4 MG/2ML IJ SOLN
4.0000 mg | Freq: Once | INTRAMUSCULAR | Status: AC
  Administered 2017-05-06: 4 mg via INTRAVENOUS
  Filled 2017-05-06: qty 2

## 2017-05-06 MED ORDER — SODIUM CHLORIDE 0.9 % IV BOLUS (SEPSIS): 500.0000 mL | Freq: Once | INTRAVENOUS | Status: DC

## 2017-05-06 MED ORDER — IOPAMIDOL (ISOVUE-300) INJECTION 61%
100.0000 mL | Freq: Once | INTRAVENOUS | Status: AC | PRN
  Administered 2017-05-06: 80 mL via INTRAVENOUS

## 2017-05-06 MED ORDER — IOPAMIDOL (ISOVUE-300) INJECTION 61%
INTRAVENOUS | Status: AC
  Filled 2017-05-06: qty 100

## 2017-05-06 MED ORDER — SODIUM CHLORIDE 0.9 % IV BOLUS (SEPSIS)
1000.0000 mL | Freq: Once | INTRAVENOUS | Status: AC
  Administered 2017-05-06: 1000 mL via INTRAVENOUS

## 2017-05-06 NOTE — ED Notes (Signed)
Bed: IZ12 Expected date:  Expected time:  Means of arrival:  Comments: Chemo, Weakness, nausea, vomiting

## 2017-05-06 NOTE — ED Triage Notes (Signed)
Per EMS, patient has been diagnosed with stomach cancer and had chemo on Monday July 2nd and has been complaining of nausea and vomiting and no appetite ever since. EMS gave her 4mg  po zofran in route. Pt currently denies nausea.

## 2017-05-06 NOTE — H&P (Signed)
History and Physical    Penny Stewart HWE:[id number] DOB: 09/24/1946 DOA: 05/06/2017  Referring MD/NP/PA:   PCP: Foye Spurling, MD   Patient coming from:  The patient is coming from home.  At baseline, pt is partially dependent for most of ADL.  Chief Complaint: Nausea, vomiting, abdominal pain  HPI: Penny Stewart is a 71 y.o. 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  Her symptoms started last night. She vomited at least 8 times. Her abdominal pain is diffuse, severe, sharp, nonradiating. Her last bowel movement was a week ago. Patient does not have fever or chills. Patient denies chest pain, SOB, cough, symptoms of UTI or unilateral weakness. Of note,  Pt  Is s/p of brain radiation therapy on 03/31/17, and currently she is on chemotherapy, last chemotherapy was on Monday 04/28/17.  ED Course: pt was found to have WBC 2.0, lactic acid is 2.65, acute renal injury with creatinine 2.40, BUN 81, temperature normal, tachycardia, tachypnea, oxygen saturation 85-94% on room air, hypotension with blood pressure 89/63 which responded to IV fluid, and improved to 93/64 after 1 L normal saline bolus. Pt is admitted to stepdown as inpatient. Gen. surgeon, Dr. Hassell Done was consulted.  CT abdomen/pelvis showed: 1. Multiple pockets of pneumoperitoneum within the upper abdomen concerning for hollow viscus perforation, exact source is unclear. The patient has also developed a small bowel obstruction with suspected transition zone in the distal small bowel/lower pelvis. 2. Fluid-filled right colon with peripheral distribution of gas bubbles, raises concern for pneumatosis. A larger gas collection within the right lower pelvic mesentery has a branching configuration raising possibility of mesenteric venous gas ; suggest  correlation with lactic acid values  3. Very dilated gallbladder 4. Decreased splenic metastatic lesions which now appear more well-defined. Decreased retroperitoneal, mesenteric, and inguinalAdenopathy.  Review of Systems:   General: no fevers, chills, no changes in body weight, has poor appetite, has fatigue HEENT: no blurry vision, hearing changes or sore throat Respiratory: no dyspnea, coughing, wheezing CV: no chest pain, no palpitations GI: has nausea, vomiting, abdominal pain, no diarrhea GU: no dysuria, burning on urination, increased urinary frequency, hematuria  Ext: no leg edema Neuro: no unilateral weakness, numbness, or tingling, no vision change or hearing loss Skin: no rash, no skin tear. MSK: No muscle spasm, no deformity, no limitation of range of movement in spin Heme: No easy bruising.  Travel history: No recent long distant travel.  Allergy: No Known Allergies  Past Medical History:  Diagnosis Date  . Adenopathy 02/07/2017  . Hypertension   . Hypothyroidism   . Left leg DVT ([place]) 04/15/2017    Past Surgical History:  Procedure Laterality Date  . MASS EXCISION Right 02/20/2017   Procedure: EXCISION DEEP AXILLARY MASS;  Surgeon: Fanny Skates, MD;  Location: [facility];  Service: General;  Laterality: Right;    Social History:  reports that she quit smoking about 2 years ago. She has a 1.50 pack-year smoking history. She has never used smokeless tobacco. She reports that she drinks about 0.6 oz of alcohol per week . She reports that she does not use drugs.  Family History:  Family History  Problem Relation Age of Onset  . Cancer Mother 73       colon cancer   . Cancer Sister 6       breast cancer  Prior to Admission medications   Medication Sig Start Date End Date Taking? Authorizing Provider  amLODipine (NORVASC) 10 MG tablet Take 10 mg by mouth daily at 12 noon.     [provider]  atorvastatin (LIPITOR) 20 MG tablet Take 20 mg by mouth at  bedtime.     [provider]  dexamethasone (DECADRON) 2 MG tablet Starting 05/04/17 take one 2mg  tablet po daily.  Starting 06/10/17 take one 2mg  tablet po every other day.  Stop on 05/19/17. 04/29/17   Hayden Pedro, PA-C  folic acid (FOLVITE) 1 MG tablet Take 1 tablet (1 mg total) by mouth daily. 03/17/17   Truitt Merle, MD  HYDROcodone-acetaminophen (NORCO) 5-325 MG tablet Take 1-2 tablets by mouth every 6 (six) hours as needed for moderate pain or severe pain. Patient not taking: Reported on 04/29/2017 02/20/17   Fanny Skates, MD  levothyroxine (SYNTHROID, LEVOTHROID) 75 MCG tablet Take 75 mcg by mouth daily before breakfast.    [provider]  mirtazapine (REMERON) 30 MG tablet TK 1 T PO QD 03/11/17   [provider]  ondansetron (ZOFRAN) 8 MG tablet Take 1 tablet (8 mg total) by mouth 2 (two) times daily as needed for refractory nausea / vomiting. Start on day 3 after chemo. Patient not taking: Reported on 04/15/2017 03/29/17   Truitt Merle, MD  prochlorperazine (COMPAZINE) 10 MG tablet Take 1 tablet (10 mg total) by mouth every 6 (six) hours as needed (Nausea or vomiting). Patient not taking: Reported on 04/15/2017 03/29/17   Truitt Merle, MD  rivaroxaban (XARELTO) 20 MG TABS tablet Take 1 tablet (20 mg total) by mouth daily with supper. Begin after you complete the starter pack. 04/28/17   Owens Shark, NP  Rivaroxaban 15 & 20 MG TBPK Take as directed on package: Start with one 15mg  tablet by mouth twice a day with food. On Day 22, switch to one 20mg  tablet once a day with food. 04/15/17   Owens Shark, NP  vitamin B-12 (CYANOCOBALAMIN) 1000 MCG tablet Take 1,000 mcg by mouth daily.    [provider]    Physical Exam: Vitals:   05/06/17 2115 05/06/17 2245 05/06/17 2247 05/06/17 2345  BP: 104/77 117/60 117/60 91/63  Pulse:  96 93   Resp: (!) 21 18 16 16   Temp:      TempSrc:      SpO2:  95% 94%    General: Not in acute distress HEENT:       Eyes: PERRL,  EOMI, no scleral icterus.       ENT: No discharge from the ears and nose, no pharynx injection, no tonsillar enlargement.        Neck: No JVD, no bruit, no mass felt. Heme: No neck lymph node enlargement. Cardiac: S1/S2, RRR, No murmurs, No gallops or rubs. Respiratory: No rales, wheezing, rhonchi or rubs. GI: distended, diffusely tender, no rebound pain, no organomegaly, BS present. GU: No hematuria Ext: No pitting leg edema bilaterally. 2+DP/PT pulse bilaterally. Musculoskeletal: No joint deformities, No joint redness or warmth, no limitation of ROM in spin. Skin: No rashes.  Neuro: Alert, oriented X3, cranial nerves II-XII grossly intact, moves all extremities normally.   Psych: Patient is not psychotic, no suicidal or hemocidal ideation.  Labs on Admission: I have personally reviewed following labs and imaging studies  CBC:  Recent Labs Lab 05/06/17 1908  WBC 2.0*  NEUTROABS 1.7  HGB 11.2*  HCT 34.1*  MCV 78.8  PLT 132*  Basic Metabolic Panel:  Recent Labs Lab 05/06/17 1908  NA 136  K 4.5  CL 91*  CO2 33*  GLUCOSE 146*  BUN 81*  CREATININE 2.40*  CALCIUM 7.6*   GFR: Estimated Creatinine Clearance: 15.2 mL/min (A) (by C-G formula based on SCr of 2.4 mg/dL (H)). Liver Function Tests:  Recent Labs Lab 05/06/17 1908  AST 50*  ALT 29  ALKPHOS 91  BILITOT 0.3  PROT 6.5  ALBUMIN 2.6*    Recent Labs Lab 05/06/17 1908  LIPASE <10*   No results for input(s): AMMONIA in the last 168 hours. Coagulation Profile: No results for input(s): INR, PROTIME in the last 168 hours. Cardiac Enzymes: No results for input(s): CKTOTAL, CKMB, CKMBINDEX, TROPONINI in the last 168 hours. BNP (last 3 results) No results for input(s): PROBNP in the last 8760 hours. HbA1C: No results for input(s): HGBA1C in the last 72 hours. CBG: No results for input(s): GLUCAP in the last 168 hours. Lipid Profile: No results for input(s): CHOL, HDL, LDLCALC, TRIG, CHOLHDL, LDLDIRECT  in the last 72 hours. Thyroid Function Tests: No results for input(s): TSH, T4TOTAL, FREET4, T3FREE, THYROIDAB in the last 72 hours. Anemia Panel: No results for input(s): VITAMINB12, FOLATE, FERRITIN, TIBC, IRON, RETICCTPCT in the last 72 hours. Urine analysis:    Component Value Date/Time   PROTEINUR < 30 04/07/2017 1410   Sepsis Labs: @LABRCNTIP (procalcitonin:4,lacticidven:4) ) Recent Results (from the past 240 hour(s))  TECHNOLOGIST REVIEW     Status: None   Collection Time: 04/28/17 11:24 AM  Result Value Ref Range Status   Technologist Review Oc meta and myelocyte  Final     Radiological Exams on Admission: Ct Abdomen Pelvis W Contrast  Result Date: 05/06/2017 CLINICAL DATA:  Stomach cancer nausea and vomiting EXAM: CT ABDOMEN AND PELVIS WITH CONTRAST TECHNIQUE: Multidetector CT imaging of the abdomen and pelvis was performed using the standard protocol following bolus administration of intravenous contrast. CONTRAST:  61mL ISOVUE-300 IOPAMIDOL (ISOVUE-300) INJECTION 61%, 63mL ISOVUE-300 IOPAMIDOL (ISOVUE-300) INJECTION 61% COMPARISON:  Head CT 02/27/2017, CT abdomen pelvis 02/07/2017 FINDINGS: Lower chest: Lung bases demonstrate no acute consolidation or pleural effusion. The heart is nonenlarged. Mild emphysema. Hepatobiliary: Stable subcentimeter hypodensity in the central liver. No new hepatic lesions. Markedly dilated gallbladder measuring up to 6.2 cm. No calcified stones. No biliary dilatation. Pancreas: Unremarkable. No pancreatic ductal dilatation or surrounding inflammatory changes. Spleen: Multiple hypodense metastatic lesions in the spleen, these now appear more circumscribed and well-defined. Decreased size of a dominant index lesion in the posterior spleen measuring 2.9 cm compared with 3.1 cm previously. Adrenals/Urinary Tract: Adrenal glands are within normal limits. Stable large cyst in the mid to lower right kidney measuring approximately 5.2 cm. Bladder unremarkable.  Stomach/Bowel: The stomach is dilated. Multiple loops of fluid-filled dilated small bowel measuring up to 3.8 cm. Suspected transition zone in the distal small bowel/ lower pelvis, series 2, image number 64, findings consistent with mechanical small bowel obstruction. Diffuse fluid-filled right colon with some peripheral distribution of gas bubbles. No significant colon wall thickening. Normal appendix. Vascular/Lymphatic: Aortic atherosclerosis. Decreased retroperitoneal adenopathy, for example right para aortic lymph node measures 11 mm, compared with 15 mm previously. A left para-aortic lymph node measures 14 mm compare with 17 mm previously. Central mesenteric pelvic nodules are also decreased. There is decreased inguinal adenopathy, for example left inguinal lymph node measures 13 mm compared with 21 mm previously. No increasing adenopathy. Reproductive: Uterus and bilateral adnexa are unremarkable. Other: Small free fluid in the  pelvis. Multiple pockets of free air within the upper abdomen. Larger collection of gas within the right lower mesenteric, series 2, image number 52 with somewhat branching appearance. Musculoskeletal: No acute osseous abnormality. IMPRESSION: 1. Multiple pockets of pneumoperitoneum within the upper abdomen concerning for hollow viscus perforation, exact source is unclear. The patient has also developed a small bowel obstruction with suspected transition zone in the distal small bowel/lower pelvis. 2. Fluid-filled right colon with peripheral distribution of gas bubbles, raises concern for pneumatosis. A larger gas collection within the right lower pelvic mesentery has a branching configuration raising possibility of mesenteric venous gas ; suggest correlation with lactic acid values 3. Very dilated gallbladder 4. Decreased splenic metastatic lesions which now appear more well-defined. Decreased retroperitoneal, mesenteric, and inguinal adenopathy. Critical Value/emergent results were  called by telephone at the time of interpretation on 05/06/2017 at 10:43 pm to Dr. Threasa Beards BELFI , who verbally acknowledged these results. Electronically Signed   By: Donavan Foil M.D.   On: 05/06/2017 22:43     EKG: Not done in ED, will get one.   Assessment/Plan Principal Problem:   Perforation of viscus Active Problems:   Metastatic lung cancer (metastasis from lung to other site), unspecified laterality (HCC)   Left leg DVT (HCC)   Sepsis (HCC)   AKI (acute kidney injury) ([place])   SBO (small bowel obstruction) (HCC)   Perforation of viscus and SBO and sepsis: Patient meets criteria for sepsis with elevated lactic acid 2.65, hypotension, neutropenia, tachycardia and tachypnea. Blood pressure responded to IV fluid. Currently blood pressure is 93/64. Gen. surgeon, Dr. Hassell Done was consulted. Patient is not a good candidate for surgery.  -will admit to SUD as inpt -start IV zosyn -will get Procalcitonin and trend lactic acid levels per sepsis protocol. -IVF: total of 3L of NS bolus in ED, followed by 125 cc/h  -prn morphine for pain and zofran for nasuea -palliative consult -NG tubs  Metastatic lung cancer (metastasis from lung to other site), unspecified laterality [facility]): s/p of brain radiation 17. Patient is currently on chemotherapy. Pt is being treated by Dr. Burr Medico.  -f/u with dr. Burr Medico -will add dr. Burr Medico to treatment team -hold oral decadron-->start IV cortef 5 mg daily  Left leg DVT [facility]): -switch Xarelto to IV heparin per pharm  AKI: cre 2.40. Likely due to prerenal secondary to dehydration - IVF as above - Check FeNa or FeUrea - Follow up renal function by BMP  DVT ppx: on IV Heparin  Code Status: DNR (I discussed with patient in the presence of her husband, and explained the meaning of CODE STATUS. Patient wants to be DNR) Family Communication: Yes, patient'shusband at bed side Disposition Plan:  Anticipate discharge back to previous home environment Consults called:   Gen. surgeon, Dr. Hassell Done Admission status:   SDU/inpation       Date of Service 05/07/2017    Ivor Costa [facility] Pager [phone]  If 7PM-7AM, please contact night-coverage [email or web address] Password [facility] 05/07/2017, 12:31 AM

## 2017-05-06 NOTE — ED Provider Notes (Signed)
[facility] DEPT Provider Note   CSN: [id number] Arrival date & time: 05/06/17  [place]     History   Chief Complaint Chief Complaint  Patient presents with  . Emesis  . Weakness  . Nausea    HPI Penny Stewart is a 71 y.o. female.  Patient is a 71 year old female with a history of metastatic lung cancer who presents with nausea and vomiting. Per family, she completed her second round of chemotherapy 8 days ago. She states over the last 2-3 days she's had nausea and vomiting. She hasn't been able keep anything down and 2 days. She has had some constipation over the last week. She's tried milk of magnesia and mag citrate without improvement in symptoms. She denies any abdominal pain. Her family states she's been very weak over the last 24 hours. No known fevers. No confusion. She denies any urinary symptoms. No hematemesis. Of note she is on Xarelto for a DVT. She does have brain metastases which is status post radiation treatment as well as Decadron.      Past Medical History:  Diagnosis Date  . Adenopathy 02/07/2017  . Hypertension   . Hypothyroidism   . Left leg DVT ([place]) 04/15/2017    Patient Active Problem List   Diagnosis Date Noted  . Perforation of viscus 05/06/2017  . Sepsis ([place]) 05/06/2017  . AKI (acute kidney injury) ([place]) 05/06/2017  . Hypertension   . Hypothyroidism   . Left leg DVT ([place]) 04/15/2017  . Brain metastases ([place]) 03/13/2017  . Goals of care, counseling/discussion 03/01/2017  . Metastatic lung cancer (metastasis from lung to other site), unspecified laterality ([place]) 02/27/2017  . Adenopathy 02/07/2017    Past Surgical History:  Procedure Laterality Date  . MASS EXCISION Right 02/20/2017   Procedure: EXCISION DEEP AXILLARY MASS;  Surgeon: Fanny Skates, MD;  Location: [facility];  Service: General;  Laterality: Right;    OB History    No data available       Home Medications    Prior to Admission medications   Medication Sig Start Date  End Date Taking? Authorizing Provider  amLODipine (NORVASC) 10 MG tablet Take 10 mg by mouth daily at 12 noon.     [provider]  atorvastatin (LIPITOR) 20 MG tablet Take 20 mg by mouth at bedtime.     [provider]  dexamethasone (DECADRON) 2 MG tablet Starting 05/04/17 take one 2mg  tablet po daily.  Starting May 12, 2017 take one 2mg  tablet po every other day.  Stop on 05/19/17. 04/29/17   Hayden Pedro, PA-C  folic acid (FOLVITE) 1 MG tablet Take 1 tablet (1 mg total) by mouth daily. 03/17/17   Truitt Merle, MD  HYDROcodone-acetaminophen (NORCO) 5-325 MG tablet Take 1-2 tablets by mouth every 6 (six) hours as needed for moderate pain or severe pain. Patient not taking: Reported on 04/29/2017 02/20/17   Fanny Skates, MD  levothyroxine (SYNTHROID, LEVOTHROID) 75 MCG tablet Take 75 mcg by mouth daily before breakfast.    [provider]  mirtazapine (REMERON) 30 MG tablet TK 1 T PO QD 03/11/17   [provider]  ondansetron (ZOFRAN) 8 MG tablet Take 1 tablet (8 mg total) by mouth 2 (two) times daily as needed for refractory nausea / vomiting. Start on day 3 after chemo. Patient not taking: Reported on 04/15/2017 03/29/17   Truitt Merle, MD  prochlorperazine (COMPAZINE) 10 MG tablet Take 1 tablet (10 mg total) by mouth every 6 (six) hours as needed (Nausea or  vomiting). Patient not taking: Reported on 04/15/2017 03/29/17   Truitt Merle, MD  rivaroxaban (XARELTO) 20 MG TABS tablet Take 1 tablet (20 mg total) by mouth daily with supper. Begin after you complete the starter pack. 04/28/17   Owens Shark, NP  Rivaroxaban 15 & 20 MG TBPK Take as directed on package: Start with one 15mg  tablet by mouth twice a day with food. On Day 22, switch to one 20mg  tablet once a day with food. 04/15/17   Owens Shark, NP  vitamin B-12 (CYANOCOBALAMIN) 1000 MCG tablet Take 1,000 mcg by mouth daily.    [provider]    Family History Family History  Problem Relation Age of Onset  .  Cancer Mother 15       colon cancer   . Cancer Sister 5       breast cancer     Social History Social History  Substance Use Topics  . Smoking status: Former Smoker    Packs/day: 15.00    Years: 0.10    Quit date: 10/28/2014  . Smokeless tobacco: Never Used  . Alcohol use 0.6 oz/week    1 Glasses of wine per week     Comment: 1-2 times a week      Allergies   Patient has no known allergies.   Review of Systems Review of Systems  Constitutional: Positive for fatigue. Negative for chills, diaphoresis and fever.  HENT: Negative for congestion, rhinorrhea and sneezing.   Eyes: Negative.   Respiratory: Negative for cough, chest tightness and shortness of breath.   Cardiovascular: Negative for chest pain and leg swelling.  Gastrointestinal: Positive for nausea and vomiting. Negative for abdominal pain, blood in stool and diarrhea.  Genitourinary: Negative for difficulty urinating, flank pain, frequency and hematuria.  Musculoskeletal: Negative for arthralgias and back pain.  Skin: Negative for rash.  Neurological: Negative for dizziness, speech difficulty, weakness, numbness and headaches.     Physical Exam Updated Vital Signs BP 117/60 (BP Location: Left Arm)   Pulse 93   Temp 97.7 F (36.5 C) (Oral)   Resp 16   SpO2 94%   Physical Exam  Constitutional: She is oriented to person, place, and time. She appears well-developed and well-nourished.  HENT:  Head: Normocephalic and atraumatic.  Eyes: Pupils are equal, round, and reactive to light.  Neck: Normal range of motion. Neck supple.  Cardiovascular: Normal rate, regular rhythm and normal heart sounds.   Pulmonary/Chest: Effort normal and breath sounds normal. No respiratory distress. She has no wheezes. She has no rales. She exhibits no tenderness.  Abdominal: Soft. Bowel sounds are normal. There is tenderness (Moderate diffuse tenderness). There is no rebound and no guarding.  Musculoskeletal: Normal range of  motion. She exhibits no edema.  Lymphadenopathy:    She has no cervical adenopathy.  Neurological: She is oriented to person, place, and time.  Patient is sleepy but will wake up and answer questions appropriately, she's moving all extremities symmetrically without focal deficits  Skin: Skin is warm and dry. No rash noted.  Psychiatric: She has a normal mood and affect.     ED Treatments / Results  Labs (all labs ordered are listed, but only abnormal results are displayed) Labs Reviewed  COMPREHENSIVE METABOLIC PANEL - Abnormal; Notable for the following:       Result Value   Chloride 91 (*)    CO2 33 (*)    Glucose, Bld 146 (*)    BUN 81 (*)  Creatinine, Ser 2.40 (*)    Calcium 7.6 (*)    Albumin 2.6 (*)    AST 50 (*)    GFR calc non Af Amer 19 (*)    GFR calc Af Amer 22 (*)    All other components within normal limits  CBC WITH DIFFERENTIAL/PLATELET - Abnormal; Notable for the following:    WBC 2.0 (*)    Hemoglobin 11.2 (*)    HCT 34.1 (*)    MCH 25.9 (*)    RDW 19.6 (*)    Platelets 132 (*)    Lymphs Abs 0.3 (*)    Monocytes Absolute 0.0 (*)    All other components within normal limits  LIPASE, BLOOD - Abnormal; Notable for the following:    Lipase <10 (*)    All other components within normal limits  I-STAT CG4 LACTIC ACID, ED - Abnormal; Notable for the following:    Lactic Acid, Venous 2.65 (*)    All other components within normal limits  CULTURE, BLOOD (ROUTINE X 2)  CULTURE, BLOOD (ROUTINE X 2)  URINALYSIS, ROUTINE W REFLEX MICROSCOPIC    EKG  EKG Interpretation None       Radiology Ct Abdomen Pelvis W Contrast  Result Date: 05/06/2017 CLINICAL DATA:  Stomach cancer nausea and vomiting EXAM: CT ABDOMEN AND PELVIS WITH CONTRAST TECHNIQUE: Multidetector CT imaging of the abdomen and pelvis was performed using the standard protocol following bolus administration of intravenous contrast. CONTRAST:  48mL ISOVUE-300 IOPAMIDOL (ISOVUE-300) INJECTION 61%,  48mL ISOVUE-300 IOPAMIDOL (ISOVUE-300) INJECTION 61% COMPARISON:  Head CT 02/27/2017, CT abdomen pelvis 02/07/2017 FINDINGS: Lower chest: Lung bases demonstrate no acute consolidation or pleural effusion. The heart is nonenlarged. Mild emphysema. Hepatobiliary: Stable subcentimeter hypodensity in the central liver. No new hepatic lesions. Markedly dilated gallbladder measuring up to 6.2 cm. No calcified stones. No biliary dilatation. Pancreas: Unremarkable. No pancreatic ductal dilatation or surrounding inflammatory changes. Spleen: Multiple hypodense metastatic lesions in the spleen, these now appear more circumscribed and well-defined. Decreased size of a dominant index lesion in the posterior spleen measuring 2.9 cm compared with 3.1 cm previously. Adrenals/Urinary Tract: Adrenal glands are within normal limits. Stable large cyst in the mid to lower right kidney measuring approximately 5.2 cm. Bladder unremarkable. Stomach/Bowel: The stomach is dilated. Multiple loops of fluid-filled dilated small bowel measuring up to 3.8 cm. Suspected transition zone in the distal small bowel/ lower pelvis, series 2, image number 64, findings consistent with mechanical small bowel obstruction. Diffuse fluid-filled right colon with some peripheral distribution of gas bubbles. No significant colon wall thickening. Normal appendix. Vascular/Lymphatic: Aortic atherosclerosis. Decreased retroperitoneal adenopathy, for example right para aortic lymph node measures 11 mm, compared with 15 mm previously. A left para-aortic lymph node measures 14 mm compare with 17 mm previously. Central mesenteric pelvic nodules are also decreased. There is decreased inguinal adenopathy, for example left inguinal lymph node measures 13 mm compared with 21 mm previously. No increasing adenopathy. Reproductive: Uterus and bilateral adnexa are unremarkable. Other: Small free fluid in the pelvis. Multiple pockets of free air within the upper abdomen.  Larger collection of gas within the right lower mesenteric, series 2, image number 52 with somewhat branching appearance. Musculoskeletal: No acute osseous abnormality. IMPRESSION: 1. Multiple pockets of pneumoperitoneum within the upper abdomen concerning for hollow viscus perforation, exact source is unclear. The patient has also developed a small bowel obstruction with suspected transition zone in the distal small bowel/lower pelvis. 2. Fluid-filled right colon with peripheral distribution of gas  bubbles, raises concern for pneumatosis. A larger gas collection within the right lower pelvic mesentery has a branching configuration raising possibility of mesenteric venous gas ; suggest correlation with lactic acid values 3. Very dilated gallbladder 4. Decreased splenic metastatic lesions which now appear more well-defined. Decreased retroperitoneal, mesenteric, and inguinal adenopathy. Critical Value/emergent results were called by telephone at the time of interpretation on 05/06/2017 at 10:43 pm to Dr. Threasa Beards Annina Piotrowski , who verbally acknowledged these results. Electronically Signed   By: Donavan Foil M.D.   On: 05/06/2017 22:43    Procedures Procedures (including critical care time)  Medications Ordered in ED Medications  iopamidol (ISOVUE-300) 61 % injection (not administered)  iopamidol (ISOVUE-300) 61 % injection (not administered)  iopamidol (ISOVUE-300) 61 % injection 30 mL (30 mLs Oral Contrast Given 05/06/17 2010)  sodium chloride 0.9 % bolus 500 mL (not administered)  piperacillin-tazobactam (ZOSYN) IVPB 3.375 g (not administered)  sodium chloride 0.9 % bolus 1,000 mL (0 mLs Intravenous Stopped 05/06/17 2258)  ondansetron (ZOFRAN) injection 4 mg (4 mg Intravenous Given 05/06/17 2020)  iopamidol (ISOVUE-300) 61 % injection 100 mL (80 mLs Intravenous Contrast Given 05/06/17 2201)     Initial Impression / Assessment and Plan / ED Course  I have reviewed the triage vital signs and the nursing  notes.  Pertinent labs & imaging results that were available during my care of the patient were reviewed by me and considered in my medical decision making (see chart for details).     Patient with a history of metastatic cancer presents with abdominal pain and vomiting. She has evidence of a small bowel obstruction with associated pneumoperitoneum and pneumatosis of the colon. Also complicating the issue is the fact that she is on Xarelto. I spoke with the surgeon on call, Dr. Hassell Done who did not feel that she would be an appropriate candidate for immediate surgery. He requested a medicine admission and they will consult in the morning. Patient was resuscitated with IV fluids. She was given Zosyn. NG tube will be placed. I had a long discussion with the family about the seriousness of her condition. At this point they still want to progress with aggressive treatment. However, THE PATIENT AND HER HUSBAND BOTH ARE IN AGREEMENT TO MAKE HER DNR.  They feel that if her heart were to stop, they would not want to proceed with CPR or intubation. I spoke with Dr. Blaine Hamper with the hospitalist service to has agreed to admit the patient for further treatment.  CRITICAL CARE Performed by: Link Burgeson Total critical care time: 60 minutes Critical care time was exclusive of separately billable procedures and treating other patients. Critical care was necessary to treat or prevent imminent or life-threatening deterioration. Critical care was time spent personally by me on the following activities: development of treatment plan with patient and/or surrogate as well as nursing, discussions with consultants, evaluation of patient's response to treatment, examination of patient, obtaining history from patient or surrogate, ordering and performing treatments and interventions, ordering and review of laboratory studies, ordering and review of radiographic studies, pulse oximetry and re-evaluation of patient's  condition.   Final Clinical Impressions(s) / ED Diagnoses   Final diagnoses:  Essential hypertension  Hypothyroidism, unspecified type    New Prescriptions New Prescriptions   No medications on file     Malvin Johns, MD 05/06/17 2356

## 2017-05-07 ENCOUNTER — Inpatient Hospital Stay (HOSPITAL_COMMUNITY): Payer: Medicare Other

## 2017-05-07 DIAGNOSIS — I9589 Other hypotension: Secondary | ICD-10-CM | POA: Diagnosis not present

## 2017-05-07 DIAGNOSIS — D61810 Antineoplastic chemotherapy induced pancytopenia: Secondary | ICD-10-CM

## 2017-05-07 DIAGNOSIS — N179 Acute kidney failure, unspecified: Secondary | ICD-10-CM | POA: Diagnosis present

## 2017-05-07 DIAGNOSIS — Z515 Encounter for palliative care: Secondary | ICD-10-CM | POA: Diagnosis not present

## 2017-05-07 DIAGNOSIS — R64 Cachexia: Secondary | ICD-10-CM | POA: Diagnosis present

## 2017-05-07 DIAGNOSIS — Z87891 Personal history of nicotine dependence: Secondary | ICD-10-CM | POA: Diagnosis not present

## 2017-05-07 DIAGNOSIS — E039 Hypothyroidism, unspecified: Secondary | ICD-10-CM | POA: Diagnosis present

## 2017-05-07 DIAGNOSIS — Z803 Family history of malignant neoplasm of breast: Secondary | ICD-10-CM | POA: Diagnosis not present

## 2017-05-07 DIAGNOSIS — E876 Hypokalemia: Secondary | ICD-10-CM | POA: Diagnosis not present

## 2017-05-07 DIAGNOSIS — Z66 Do not resuscitate: Secondary | ICD-10-CM

## 2017-05-07 DIAGNOSIS — K631 Perforation of intestine (nontraumatic): Secondary | ICD-10-CM

## 2017-05-07 DIAGNOSIS — C779 Secondary and unspecified malignant neoplasm of lymph node, unspecified: Secondary | ICD-10-CM | POA: Diagnosis not present

## 2017-05-07 DIAGNOSIS — C7931 Secondary malignant neoplasm of brain: Secondary | ICD-10-CM | POA: Diagnosis present

## 2017-05-07 DIAGNOSIS — R198 Other specified symptoms and signs involving the digestive system and abdomen: Secondary | ICD-10-CM | POA: Diagnosis not present

## 2017-05-07 DIAGNOSIS — K56600 Partial intestinal obstruction, unspecified as to cause: Secondary | ICD-10-CM

## 2017-05-07 DIAGNOSIS — T451X5A Adverse effect of antineoplastic and immunosuppressive drugs, initial encounter: Secondary | ICD-10-CM | POA: Diagnosis present

## 2017-05-07 DIAGNOSIS — E43 Unspecified severe protein-calorie malnutrition: Secondary | ICD-10-CM

## 2017-05-07 DIAGNOSIS — C3490 Malignant neoplasm of unspecified part of unspecified bronchus or lung: Secondary | ICD-10-CM | POA: Diagnosis present

## 2017-05-07 DIAGNOSIS — Z681 Body mass index (BMI) 19 or less, adult: Secondary | ICD-10-CM | POA: Diagnosis not present

## 2017-05-07 DIAGNOSIS — F329 Major depressive disorder, single episode, unspecified: Secondary | ICD-10-CM | POA: Diagnosis present

## 2017-05-07 DIAGNOSIS — Z86718 Personal history of other venous thrombosis and embolism: Secondary | ICD-10-CM | POA: Diagnosis not present

## 2017-05-07 DIAGNOSIS — I82402 Acute embolism and thrombosis of unspecified deep veins of left lower extremity: Secondary | ICD-10-CM | POA: Diagnosis present

## 2017-05-07 DIAGNOSIS — D61818 Other pancytopenia: Secondary | ICD-10-CM | POA: Diagnosis not present

## 2017-05-07 DIAGNOSIS — I10 Essential (primary) hypertension: Secondary | ICD-10-CM | POA: Diagnosis present

## 2017-05-07 DIAGNOSIS — E785 Hyperlipidemia, unspecified: Secondary | ICD-10-CM | POA: Diagnosis present

## 2017-05-07 DIAGNOSIS — K56609 Unspecified intestinal obstruction, unspecified as to partial versus complete obstruction: Secondary | ICD-10-CM | POA: Diagnosis not present

## 2017-05-07 DIAGNOSIS — E162 Hypoglycemia, unspecified: Secondary | ICD-10-CM | POA: Diagnosis not present

## 2017-05-07 DIAGNOSIS — Z0189 Encounter for other specified special examinations: Secondary | ICD-10-CM | POA: Diagnosis present

## 2017-05-07 DIAGNOSIS — A419 Sepsis, unspecified organism: Secondary | ICD-10-CM | POA: Diagnosis present

## 2017-05-07 DIAGNOSIS — Z800 Family history of malignant neoplasm of digestive organs: Secondary | ICD-10-CM | POA: Diagnosis not present

## 2017-05-07 DIAGNOSIS — I959 Hypotension, unspecified: Secondary | ICD-10-CM | POA: Diagnosis present

## 2017-05-07 DIAGNOSIS — C786 Secondary malignant neoplasm of retroperitoneum and peritoneum: Secondary | ICD-10-CM | POA: Diagnosis present

## 2017-05-07 DIAGNOSIS — Z7901 Long term (current) use of anticoagulants: Secondary | ICD-10-CM | POA: Diagnosis not present

## 2017-05-07 LAB — TYPE AND SCREEN
ABO/RH(D): A NEG
ANTIBODY SCREEN: NEGATIVE

## 2017-05-07 LAB — CBC
HCT: 29.5 % — ABNORMAL LOW (ref 36.0–46.0)
HEMOGLOBIN: 9.9 g/dL — AB (ref 12.0–15.0)
MCH: 26.5 pg (ref 26.0–34.0)
MCHC: 33.6 g/dL (ref 30.0–36.0)
MCV: 78.9 fL (ref 78.0–100.0)
PLATELETS: 113 10*3/uL — AB (ref 150–400)
RBC: 3.74 MIL/uL — ABNORMAL LOW (ref 3.87–5.11)
RDW: 19.9 % — AB (ref 11.5–15.5)
WBC: 1.3 10*3/uL — CL (ref 4.0–10.5)

## 2017-05-07 LAB — BASIC METABOLIC PANEL
Anion gap: 11 (ref 5–15)
BUN: 75 mg/dL — AB (ref 6–20)
CALCIUM: 6.6 mg/dL — AB (ref 8.9–10.3)
CO2: 29 mmol/L (ref 22–32)
CREATININE: 2.03 mg/dL — AB (ref 0.44–1.00)
Chloride: 96 mmol/L — ABNORMAL LOW (ref 101–111)
GFR calc Af Amer: 27 mL/min — ABNORMAL LOW (ref >60)
GFR, EST NON AFRICAN AMERICAN: 24 mL/min — AB (ref >60)
GLUCOSE: 122 mg/dL — AB (ref 65–99)
Potassium: 4 mmol/L (ref 3.5–5.1)
Sodium: 136 mmol/L (ref 135–145)

## 2017-05-07 LAB — URINALYSIS, ROUTINE W REFLEX MICROSCOPIC
BILIRUBIN URINE: NEGATIVE
Glucose, UA: NEGATIVE mg/dL
HGB URINE DIPSTICK: NEGATIVE
KETONES UR: NEGATIVE mg/dL
LEUKOCYTES UA: NEGATIVE
NITRITE: NEGATIVE
PH: 5 (ref 5.0–8.0)
Protein, ur: 30 mg/dL — AB
SQUAMOUS EPITHELIAL / LPF: NONE SEEN
Specific Gravity, Urine: 1.039 — ABNORMAL HIGH (ref 1.005–1.030)

## 2017-05-07 LAB — APTT
APTT: 81 s — AB (ref 24–36)
aPTT: 72 seconds — ABNORMAL HIGH (ref 24–36)

## 2017-05-07 LAB — CREATININE, URINE, RANDOM: Creatinine, Urine: 79.28 mg/dL

## 2017-05-07 LAB — GLUCOSE, CAPILLARY: GLUCOSE-CAPILLARY: 91 mg/dL (ref 65–99)

## 2017-05-07 LAB — PROCALCITONIN: Procalcitonin: 88.13 ng/mL

## 2017-05-07 LAB — PROTIME-INR
INR: 3.98
PROTHROMBIN TIME: 39.8 s — AB (ref 11.4–15.2)

## 2017-05-07 LAB — HEPARIN LEVEL (UNFRACTIONATED)

## 2017-05-07 LAB — MRSA PCR SCREENING: MRSA by PCR: NEGATIVE

## 2017-05-07 LAB — LACTIC ACID, PLASMA
Lactic Acid, Venous: 2.4 mmol/L (ref 0.5–1.9)
Lactic Acid, Venous: 3.4 mmol/L (ref 0.5–1.9)
Lactic Acid, Venous: 1.6 mmol/L (ref 0.5–1.9)

## 2017-05-07 LAB — SODIUM, URINE, RANDOM

## 2017-05-07 LAB — ABO/RH: ABO/RH(D): A NEG

## 2017-05-07 MED ORDER — MORPHINE SULFATE (PF) 2 MG/ML IV SOLN
1.0000 mg | INTRAVENOUS | Status: DC | PRN
  Administered 2017-05-08 (×2): 1 mg via INTRAVENOUS
  Filled 2017-05-07 (×2): qty 1

## 2017-05-07 MED ORDER — HEPARIN (PORCINE) IN NACL 100-0.45 UNIT/ML-% IJ SOLN: 500.0000 [IU]/h | INTRAMUSCULAR | Status: DC

## 2017-05-07 MED ORDER — PIPERACILLIN-TAZOBACTAM IN DEX 2-0.25 GM/50ML IV SOLN
2.2500 g | Freq: Three times a day (TID) | INTRAVENOUS | Status: DC
  Administered 2017-05-07 – 2017-05-09 (×7): 2.25 g via INTRAVENOUS
  Filled 2017-05-07 (×9): qty 50

## 2017-05-07 MED ORDER — ONDANSETRON HCL 4 MG/2ML IJ SOLN: 4.0000 mg | Freq: Three times a day (TID) | INTRAMUSCULAR | Status: DC | PRN

## 2017-05-07 MED ORDER — LEVOTHYROXINE SODIUM 100 MCG IV SOLR
37.5000 ug | Freq: Every day | INTRAVENOUS | Status: DC
  Administered 2017-05-07 – 2017-05-09 (×3): 37.5 ug via INTRAVENOUS
  Filled 2017-05-07 (×3): qty 5

## 2017-05-07 MED ORDER — PIPERACILLIN-TAZOBACTAM 3.375 G IVPB
3.3750 g | Freq: Three times a day (TID) | INTRAVENOUS | Status: DC
  Filled 2017-05-07: qty 50

## 2017-05-07 MED ORDER — HYDROCORTISONE 5 MG PO TABS
5.0000 mg | ORAL_TABLET | Freq: Every day | ORAL | Status: DC
  Filled 2017-05-07: qty 1

## 2017-05-07 MED ORDER — HEPARIN (PORCINE) IN NACL 100-0.45 UNIT/ML-% IJ SOLN: 400.0000 [IU]/h | INTRAMUSCULAR | Status: DC

## 2017-05-07 MED ORDER — SODIUM CHLORIDE 0.9% FLUSH
3.0000 mL | Freq: Two times a day (BID) | INTRAVENOUS | Status: DC
  Administered 2017-05-07 – 2017-05-08 (×3): 3 mL via INTRAVENOUS

## 2017-05-07 MED ORDER — SODIUM CHLORIDE 0.9 % IV SOLN
INTRAVENOUS | Status: DC
  Administered 2017-05-07 – 2017-05-08 (×3): via INTRAVENOUS

## 2017-05-07 MED ORDER — HYDROCORTISONE NA SUCCINATE PF 100 MG IJ SOLR
25.0000 mg | Freq: Every day | INTRAMUSCULAR | Status: DC
  Administered 2017-05-07 – 2017-05-09 (×3): 25 mg via INTRAVENOUS
  Filled 2017-05-07 (×3): qty 2

## 2017-05-07 MED ORDER — SODIUM CHLORIDE 0.9 % IV BOLUS (SEPSIS): 1000.0000 mL | Freq: Once | INTRAVENOUS | Status: DC

## 2017-05-07 MED ORDER — SODIUM CHLORIDE 0.9 % IV BOLUS (SEPSIS)
1500.0000 mL | Freq: Once | INTRAVENOUS | Status: AC
  Administered 2017-05-07: 1500 mL via INTRAVENOUS

## 2017-05-07 MED ORDER — HEPARIN (PORCINE) IN NACL 100-0.45 UNIT/ML-% IJ SOLN
500.0000 [IU]/h | INTRAMUSCULAR | Status: DC
  Administered 2017-05-07: 500 [IU]/h via INTRAVENOUS
  Filled 2017-05-07: qty 250

## 2017-05-07 MED ORDER — HYDROCORTISONE NA SUCCINATE PF 100 MG IJ SOLR: 50.0000 mg | Freq: Three times a day (TID) | INTRAMUSCULAR | Status: DC

## 2017-05-07 MED ORDER — SODIUM CHLORIDE 0.9 % IV BOLUS (SEPSIS)
1000.0000 mL | Freq: Once | INTRAVENOUS | Status: AC
  Administered 2017-05-07: 1000 mL via INTRAVENOUS

## 2017-05-07 MED ORDER — SODIUM CHLORIDE 0.9 % IV BOLUS (SEPSIS)
500.0000 mL | Freq: Once | INTRAVENOUS | Status: AC
  Administered 2017-05-07: 500 mL via INTRAVENOUS

## 2017-05-07 NOTE — Progress Notes (Signed)
Progress Note    Penny Stewart  ZHY:[id number] DOB: [birth year]  DOA: 05/06/2017 PCP: Foye Spurling, MD    Brief Narrative:   Chief complaint: Follow-up nausea, vomiting, abdominal pain secondary to perforated viscus  Medical records reviewed and are as summarized below:  Penny Stewart is an 71 y.o. female with a PMH of hypertension, hyperlipidemia, hypothyroidism, depression, left leg DVT on Xarelto, and metastatic lung cancer who was admitted 05/06/17 for evaluation of nausea, vomiting and abdominal pain. CT imaging done on admission showed intraperitoneal air concerning for perforated viscus.  Assessment/Plan:   Principal Problem:   Sepsis secondary to Perforation of viscus and small bowel obstruction Not felt to be a good candidate for surgery. Continue empiric Zosyn. Remains hypotensive despite aggressive fluid volume resuscitation. Continue NG tube for gastric decompression. Palliative care consulted, and has made recommendations for comfort. Family wishes to discuss plan of care with her oncologist.  Active Problems:   Metastatic lung cancer (metastasis from lung to other site), unspecified laterality ([facility])/brain metastasis Status post radiation to the brain and has been receiving chemotherapy. On IV Cortef. Poor overall prognosis given admitting problem.    Left leg DVT ([facility]) On IV heparin.    AKI (acute kidney injury) ([place]) Creatinine slightly improved today. Acute kidney injury likely prerenal from sepsis and hypotension.    Pancytopenia Likely chemotherapy-induced.    Severe protein calorie malnutrition/underweight Likely related to cachexia from cancer. Body mass index is 19.13 kg/m.   Family Communication/Anticipated D/C date and plan/Code Status   DVT prophylaxis: Heparin ordered. Code Status: DO NOT RESUSCITATE.  Family Communication: Children updated at the bedside. Disposition Plan: Likely will need residential hospice.   Medical  Consultants:    General Surgery   Anti-Infectives:    Zosyn 05/06/17--->  Subjective:   Denies current pain, nausea and dyspnea.  Objective:    Vitals:   05/07/17 0400 05/07/17 0500 05/07/17 0600 05/07/17 0700  BP: (!) 101/54 (!) 99/50 (!) 94/52 (!) 89/54  Pulse: 90 88 90 85  Resp: 20 16 18 18   Temp:      TempSrc:      SpO2: 95% 95% 95% 96%  Weight:      Height:        Intake/Output Summary (Last 24 hours) at 05/07/17 0724 Last data filed at 05/07/17 0615  Gross per 24 hour  Intake          3593.75 ml  Output              950 ml  Net          2643.75 ml   Filed Weights   05/07/17 0115  Weight: 43.7 kg (96 lb 5.5 oz)    Exam: General exam: Frail elderly female lying quietly in bed. NG tube draining bilious colored fluid. Respiratory system: Lungs are CTAB. Cardiovascular system: Heart is tachycardic. Gastrointestinal system: Abdomen is distended, quiet bowel sounds, tender to palpation. Central nervous system: Alert. Extremities: No C/E/C Skin: No rash, warm and dry. Psychiatry: Mood and affect depressed/flat  Data Reviewed:   I have personally reviewed following labs and imaging studies:  Labs: Labs show the following: BUN 75, creatinine 2.03, calcium 6.6. Glucose is 122. LFTs unremarkable. WBC 1.3, hemoglobin 9.9, platelets 113. Lactic acid rising and is 3.4 this morning. Pro calcitonin 88.13. MRSA screen is negative.  Procedures and diagnostic studies:  Ct Abdomen Pelvis W Contrast 05/06/2017: Pneumoperitoneum within the upper abdomen concerning for perforated viscus; small  bowel obstruction with suspected transition zone in the distal small bowel/lower pelvis. Dilated gallbladder.   Dg Abd Portable 1v: 05/07/2017: NG tube in the stomach.  Medications:   . hydrocortisone  5 mg Oral Daily  . iopamidol      . iopamidol      . levothyroxine  37.5 mcg Intravenous Daily  . sodium chloride flush  3 mL Intravenous Q12H   Continuous Infusions: .  sodium chloride 125 mL/hr at 05/07/17 0600  . piperacillin-tazobactam (ZOSYN)  IV      Medical decision making is of high complexity and this patient is at high risk of deterioration, therefore this is a level 3 visit.  (> 4 problem points, >4 data points, high risk: Need 2 out of 3)   Problems/DDx Points   Self limited or minor (max 2)       1   2  Established problem, stable       1   Established problem, worsening       2   New problem, no additional W/U planned (max 1)       3   3  New problem, additional W/U planned        4    Data Reviewed Points   Review/order clinical lab tests       1   1  Review/order x-rays       1   Review/order tests (Echo, EKG, PFTs, etc)       1   Discussion of test results w/ performing MD       1   Independent review of image, tracing or specimen       2   2  Decision to obtain old records       1   Review and summation of old records       2   2   Level of risk Presenting prob Diagnostics Management   Minimal 1 self limited/minor Labs CXR EKG/EEG U/A U/S Rest Gargles Bandages Dressings   Low 2 or more self limited/minor 1 stable chronic Acute uncomplicated illness Tests (PFTS) Non-CV imaging Arterial labs Biopsies of skin OTC drugs Minor surgery-no risk PT OT IVF without additives    Moderate 1 or more chronic illnesses w/ mild exac, progression or S/E from tx 2 or more stable chronic illnesses Undiagnosed new problem w/ uncertain prognosis Acute complicated injury  Stress tests Endoscopies with no risk factors Deep needle or incisional bx CV imaging without risk LP Thoracentesis Paracentesis Minor surgery w/ risks Elective major surgery w/ no risk (open, percutaneous or endoscopic) Prescription drugs Therapeutic nucl med IVF with additives Closed tx of fracture/dislocation    High Severe exac of chronic illness Acute or chronic illness/injury may pose a threat to life or bodily function (ARF) Change in neuro  status    CV imaging w/ contrast and risk Cardio electophysiologic tests Endoscopies w/ risk Discography Elective major surgery Emergency major surgery Parenteral controlled substances Drug therapy req monitoring for toxicity DNR/de-escalation of care    MDM Prob points Data points Risk   Straightforward    <1    <1    Min   Low complexity    2    2    Low   Moderate    3    3    Mod   High Complexity    4 or more    4 or more    High  LOS: 0 days   Penny Stewart,Penny Stewart  [facility] Pager [phone]. If unable to reach me by pager, please call my cell phone at [phone].  *Please refer to [email or web address], password [email or web address] to get updated schedule on who will round on this patient, as hospitalists switch teams weekly. If 7PM-7AM, please contact night-coverage at [email or web address], password [email or web address] for any overnight needs.  05/07/2017, 7:24 AM

## 2017-05-07 NOTE — Progress Notes (Addendum)
CRITICAL VALUE ALERT  Critical Value:  Lactic 3.4  Date & Time Notied:  05/07/17  0131  Provider Notified: Delrae Sawyers NP  Orders Received/Actions taken: additional fluids ordered

## 2017-05-07 NOTE — Progress Notes (Addendum)
ANTICOAGULATION CONSULT NOTE - Follow Up Consult  Pharmacy Consult for heparin Indication: hx DVT  No Known Allergies  Patient Measurements: Height: 4' 11.5" (151.1 cm) Weight: 96 lb 5.5 oz (43.7 kg) IBW/kg (Calculated) : 44.35 Heparin Dosing Weight: 43 kg  Vital Signs: Temp: 98.3 F (36.8 C) (07/11 1932) Temp Source: Oral (07/11 1932) BP: 102/59 (07/11 2000) Pulse Rate: 88 (07/11 2000)  Labs:  Recent Labs  05/06/17 1908 05/07/17 0052 05/07/17 0253 05/07/17 0827 05/07/17 1930  HGB 11.2*  --  9.9*  --   --   HCT 34.1*  --  29.5*  --   --   PLT 132*  --  113*  --   --   APTT  --  81*  --  72* >200*  LABPROT  --  39.8*  --   --   --   INR  --  3.98  --   --   --   HEPARINUNFRC  --  >2.20*  --   --   --   CREATININE 2.40*  --  2.03*  --   --     Estimated Creatinine Clearance: 17.8 mL/min (A) (by C-G formula based on SCr of 2.03 mg/dL (H)).   Medications:  xarelto 20 mg daily PTA (LD on 7/10 at 0800)  Assessment: Patient is a 71 y.o F with hx metastatic lung cancer currently undergoing chemotherapy treatment and hx DVT (04/15/2017) on xarelto PTA, presented to the ED on 05/06/17 with c/o weakness and n/v.  Abd CT showed SBO.  To transition patient to heparin drip while she's NPO.  Significant events:   Baseline aPTT elevated at 72 sec (~24 hrs after last dose of xarelto).   Today, 05/07/2017: - Initial aPTT > 200 sec, difficult to interpret in light of elevated baseline aPTT and anti-Xa level not helpful with rivaroxaban use and expect prolonged elimination d/t poor renal function.  - no obvious bleeding - heparin level and INR elevated on admission but this is d/t effect of xarelto - no bleeding documented - scr 2.03 (crcl~18)  Goal of Therapy:  Heparin level 0.3-0.7 units/ml aPTT 66-102 seconds Monitor platelets by anticoagulation protocol: Yes   Plan:  - Difficult to dose heparin based on elevated aPTT at baseline and rivaroxaban use.  - Hold heparin x 1  hr and reduce rate to 400 units/hr - check 8 hr aPTT - daily CBC and heparin level - anticipate prolonged rivaroxaban elimination d/t AKI - monitor for s/s bleeding  Doreene Eland, PharmD, BCPS.   Pager: [phone] 05/07/2017 9:01 PM

## 2017-05-07 NOTE — Progress Notes (Addendum)
ANTICOAGULATION CONSULT NOTE - Follow Up Consult  Pharmacy Consult for heparin Indication: hx DVT  No Known Allergies  Patient Measurements: Height: 4' 11.5" (151.1 cm) Weight: 96 lb 5.5 oz (43.7 kg) IBW/kg (Calculated) : 44.35 Heparin Dosing Weight: 43 kg  Vital Signs: Temp: 98.2 F (36.8 C) (07/11 0243) Temp Source: Oral (07/11 0243) BP: 89/54 (07/11 0700) Pulse Rate: 85 (07/11 0700)  Labs:  Recent Labs  05/06/17 1908 05/07/17 0052 05/07/17 0253  HGB 11.2*  --  9.9*  HCT 34.1*  --  29.5*  PLT 132*  --  113*  APTT  --  81*  --   LABPROT  --  39.8*  --   INR  --  3.98  --   HEPARINUNFRC  --  >2.20*  --   CREATININE 2.40*  --  2.03*    Estimated Creatinine Clearance: 17.8 mL/min (A) (by C-G formula based on SCr of 2.03 mg/dL (H)).   Medications:  xarelto 20 mg daily PTA (LD on 7/10 at 0800)  Assessment: Patient is a 71 y.o F with hx metastatic lung cancer currently undergoing chemotherapy treatment and hx DVT (04/15/2017) on xarelto PTA, presented to the ED on 05/06/17 with c/o weakness and n/v.  Abd CT showed SBO.  To transition patient to heparin drip while she's NPO.  Today, 05/07/2017: - aPTT elevated 72 (~24 hrs after last dose of xarelto) but with pt's hx recent DVT and high risk for thrombosis d/t mets lung cancer, will start heparin drip at a low now - heparin level and INR elevated on admission but this is d/t effect of xarelto - no bleeding documented - scr 2.03 (crcl~18)   Goal of Therapy:  Heparin level 0.3-0.7 units/ml aPTT 66-102 seconds Monitor platelets by anticoagulation protocol: Yes   Plan:  - start heparin drip at 500 units/hr (no bolus) - check 8 hr aPTT - monitor for s/s bleeding  Hiren Peplinski P 05/07/2017,7:49 AM

## 2017-05-07 NOTE — Progress Notes (Signed)
Initial Nutrition Assessment  DOCUMENTATION CODES:   Not applicable  INTERVENTION:  - Will monitor for updates to POC/GOC and associated nutrition-related needs based on this.  NUTRITION DIAGNOSIS:   Inadequate oral intake related to inability to eat as evidenced by NPO status.  GOAL:   Patient will meet greater than or equal to 90% of their needs  MONITOR:   Diet advancement, Weight trends, Labs, I & O's  REASON FOR ASSESSMENT:   Malnutrition Screening Tool  ASSESSMENT:   71 y.o. 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 Her symptoms started last night (7/9 PM). She vomited at least 8 times. Her abdominal pain is diffuse, severe, sharp, non-radiating. Her last bowel movement was a week ago. Of note, pt  is s/p of brain radiation therapy on 03/31/17, and currently she is on chemotherapy, last chemotherapy was on Monday 04/28/17.  Pt seen for MST. BMI indicates normal weight status. Pt with NGT in place to LIS with 950 mL out overnight. Pt seen by Dr. Rowe Pavy with Palliative Care this AM and that note reviewed in detail. Pt remains NPO since admission although she is wanting something to drink d/t dry mouth and throat. Will continue to monitor if pt transitions to full comfort measures only versus need for nutrition support.   Physical assessment not done at this time with respect to pt's comfort. Per chart review, she has experienced weight fluctuations over the past 2 months (96-106 lbs) with current weight being the lowest during this time frame. Unable to state malnutrition at this time based on limited information available.   Medications reviewed. Labs reviewed; Cl: 96 mmol/L, BUN: 75 mL/hr. Creatinine: 2.03 mg/dL, Ca: 6.6 mg/dL, GFR: 24 mL/min.   IVF: NS @ 125 mL/hr.    Diet Order:  Diet NPO  time specified  Skin:  Reviewed, no issues  Last BM:  PTA/unknown  Height:   Ht Readings from Last 1 Encounters:  05/07/17 4' 11.5" (1.511 m)    Weight:   Wt Readings from Last 1 Encounters:  05/07/17 96 lb 5.5 oz (43.7 kg)    Ideal Body Weight:  45.45 kg  BMI:  Body mass index is 19.13 kg/m.  Estimated Nutritional Needs:   Kcal:  1315-1530 (30-35 kcal/kg)  Protein:  66-74 grams (1.5-1.7 grams/kg)  Fluid:  >/= 1.5 L/day  EDUCATION NEEDS:   No education needs identified at this time     Jarome Matin, MS, RD, LDN, CNSC Inpatient Clinical Dietitian Pager # [phone] After hours/weekend pager # [phone]

## 2017-05-07 NOTE — Progress Notes (Addendum)
Pharmacy Antibiotic and Anticoagulation Note  Penny Stewart is a 71 y.o. female with history of lung Ca with brain and peritoneal metastasis  admitted on 05/06/2017 with intra-abdominal infection.  Pharmacy has been consulted for zosyn and IV heparin dosing  Plan: Zosyn 3.375 Gm x1 then 2.25 Gm IV q8h F/u scr/cultures Baseline aPtt/PT/INR and HL STAT Start heparin drip @ 500 units/hr today at 10 am Check 1st HL 8 hours after drip started. Goal HL=0.3-0.7  Units/ml      Temp (24hrs), Avg:97.7 F (36.5 C), Min:97.7 F (36.5 C), Max:97.7 F (36.5 C)   Recent Labs Lab 05/06/17 1908 05/06/17 2310  WBC 2.0*  --   CREATININE 2.40*  --   LATICACIDVEN  --  2.65*    Estimated Creatinine Clearance: 15.2 mL/min (A) (by C-G formula based on SCr of 2.4 mg/dL (H)).    No Known Allergies  Antimicrobials this admission: 7/11 zosyn >>    >>   Dose adjustments this admission:   Microbiology results:  BCx:   UCx:    Sputum:    MRSA PCR:   Thank you for allowing pharmacy to be a part of this patient's care.  Dorrene German 05/07/2017 1:05 AM

## 2017-05-07 NOTE — Progress Notes (Signed)
CRITICAL VALUE ALERT  Critical Value: Aptt >200  Date & Time Notied:  05/07/2017 @ 2055  Provider Notified: DR. Baltazar Najjar and Pharmacist Doreene Eland)  Orders Received/Actions taken: Hold heparin for 1 hr then resume to 39ml/hr , Aptt levels lab redo in the morning.

## 2017-05-07 NOTE — Progress Notes (Signed)
Penny Stewart   DOB:[birth year]   QV#:[id number]   PPI#:[id number]  Oncology follow-up note  Subjective: Patient is well-known to me, under my care for her metastatic lung cancer. She is on palliative chemotherapy, cycle 2 was on 7/9. She was admitted for abdominal pain and was found to have partial small bowel obstruction and perforation.   Objective:  Vitals:   05/07/17 2100 05/07/17 2200  BP: 112/60 (!) 104/57  Pulse: 96 (!) 115  Resp: (!) 23 (!) 24  Temp:      Body mass index is 19.13 kg/m.  Intake/Output Summary (Last 24 hours) at 05/07/17 2255 Last data filed at 05/07/17 2100  Gross per 24 hour  Intake             5491 ml  Output             3465 ml  Net             2026 ml     Sclerae unicteric  Oropharynx clear  No peripheral adenopathy  Lungs clear -- no rales or rhonchi  Heart regular rate and rhythm  Abdomen firm, mild diffuse tenderness   MSK no focal spinal tenderness, no peripheral edema  Neuro nonfocal   CBG (last 3)   Recent Labs  05/07/17 0741  GLUCAP 91     Labs:  Lab Results  Component Value Date   WBC 1.3 (LL) 05/07/2017   HGB 9.9 (L) 05/07/2017   HCT 29.5 (L) 05/07/2017   MCV 78.9 05/07/2017   PLT 113 (L) 05/07/2017   NEUTROABS 1.7 05/06/2017   CMP Latest Ref Rng & Units 05/07/2017 05/06/2017 04/28/2017  Glucose 65 - 99 mg/dL 122(H) 146(H) 157(H)  BUN 6 - 20 mg/dL 75(H) 81(H) 13.9  Creatinine 0.44 - 1.00 mg/dL 2.03(H) 2.40(H) 0.7  Sodium 135 - 145 mmol/L 136 136 141  Potassium 3.5 - 5.1 mmol/L 4.0 4.5 3.8  Chloride 101 - 111 mmol/L 96(L) 91(L) -  CO2 22 - 32 mmol/L 29 33(H) 30(H)  Calcium 8.9 - 10.3 mg/dL 6.6(L) 7.6(L) 9.5  Total Protein 6.5 - 8.1 g/dL - 6.5 6.7  Total Bilirubin 0.3 - 1.2 mg/dL - 0.3 0.22  Alkaline Phos 38 - 126 U/L - 91 110  AST 15 - 41 U/L - 50(H) 19  ALT 14 - 54 U/L - 29 43    Urine Studies No results for input(s): UHGB, CRYS in the last 72 hours.  Invalid input(s): UACOL, UAPR, USPG, UPH, UTP, UGL, UKET,  UBIL, UNIT, UROB, ULEU, UEPI, UWBC, URBC, UBAC, CAST, UCOM, BILUA  Basic Metabolic Panel:  Recent Labs Lab 05/06/17 1908 05/07/17 0253  NA 136 136  K 4.5 4.0  CL 91* 96*  CO2 33* 29  GLUCOSE 146* 122*  BUN 81* 75*  CREATININE 2.40* 2.03*  CALCIUM 7.6* 6.6*   GFR Estimated Creatinine Clearance: 17.8 mL/min (A) (by C-G formula based on SCr of 2.03 mg/dL (H)). Liver Function Tests:  Recent Labs Lab 05/06/17 1908  AST 50*  ALT 29  ALKPHOS 91  BILITOT 0.3  PROT 6.5  ALBUMIN 2.6*    Recent Labs Lab 05/06/17 1908  LIPASE <10*   No results for input(s): AMMONIA in the last 168 hours. Coagulation profile  Recent Labs Lab 05/07/17 0052  INR 3.98    CBC:  Recent Labs Lab 05/06/17 1908 05/07/17 0253  WBC 2.0* 1.3*  NEUTROABS 1.7  --   HGB 11.2* 9.9*  HCT 34.1* 29.5*  MCV 78.8 78.9  PLT 132* 113*   Cardiac Enzymes: No results for input(s): CKTOTAL, CKMB, CKMBINDEX, TROPONINI in the last 168 hours. BNP: Invalid input(s): POCBNP CBG:  Recent Labs Lab 05/07/17 0741  GLUCAP 91   D-Dimer No results for input(s): DDIMER in the last 72 hours. Hgb A1c No results for input(s): HGBA1C in the last 72 hours. Lipid Profile No results for input(s): CHOL, HDL, LDLCALC, TRIG, CHOLHDL, LDLDIRECT in the last 72 hours. Thyroid function studies No results for input(s): TSH, T4TOTAL, T3FREE, THYROIDAB in the last 72 hours.  Invalid input(s): FREET3 Anemia work up No results for input(s): VITAMINB12, FOLATE, FERRITIN, TIBC, IRON, RETICCTPCT in the last 72 hours. Microbiology Recent Results (from the past 240 hour(s))  TECHNOLOGIST REVIEW     Status: None   Collection Time: 04/28/17 11:24 AM  Result Value Ref Range Status   Technologist Review Oc meta and myelocyte  Final  MRSA PCR Screening     Status: None   Collection Time: 05/07/17  1:42 AM  Result Value Ref Range Status   MRSA by PCR NEGATIVE NEGATIVE Final    Comment:        The GeneXpert MRSA Assay  (FDA approved for NASAL specimens only), is one component of a comprehensive MRSA colonization surveillance program. It is not intended to diagnose MRSA infection nor to guide or monitor treatment for MRSA infections.       Studies:  Ct Abdomen Pelvis W Contrast  Result Date: 05/06/2017 CLINICAL DATA:  Stomach cancer nausea and vomiting EXAM: CT ABDOMEN AND PELVIS WITH CONTRAST TECHNIQUE: Multidetector CT imaging of the abdomen and pelvis was performed using the standard protocol following bolus administration of intravenous contrast. CONTRAST:  62mL ISOVUE-300 IOPAMIDOL (ISOVUE-300) INJECTION 61%, 52mL ISOVUE-300 IOPAMIDOL (ISOVUE-300) INJECTION 61% COMPARISON:  Head CT 02/27/2017, CT abdomen pelvis 02/07/2017 FINDINGS: Lower chest: Lung bases demonstrate no acute consolidation or pleural effusion. The heart is nonenlarged. Mild emphysema. Hepatobiliary: Stable subcentimeter hypodensity in the central liver. No new hepatic lesions. Markedly dilated gallbladder measuring up to 6.2 cm. No calcified stones. No biliary dilatation. Pancreas: Unremarkable. No pancreatic ductal dilatation or surrounding inflammatory changes. Spleen: Multiple hypodense metastatic lesions in the spleen, these now appear more circumscribed and well-defined. Decreased size of a dominant index lesion in the posterior spleen measuring 2.9 cm compared with 3.1 cm previously. Adrenals/Urinary Tract: Adrenal glands are within normal limits. Stable large cyst in the mid to lower right kidney measuring approximately 5.2 cm. Bladder unremarkable. Stomach/Bowel: The stomach is dilated. Multiple loops of fluid-filled dilated small bowel measuring up to 3.8 cm. Suspected transition zone in the distal small bowel/ lower pelvis, series 2, image number 64, findings consistent with mechanical small bowel obstruction. Diffuse fluid-filled right colon with some peripheral distribution of gas bubbles. No significant colon wall thickening.  Normal appendix. Vascular/Lymphatic: Aortic atherosclerosis. Decreased retroperitoneal adenopathy, for example right para aortic lymph node measures 11 mm, compared with 15 mm previously. A left para-aortic lymph node measures 14 mm compare with 17 mm previously. Central mesenteric pelvic nodules are also decreased. There is decreased inguinal adenopathy, for example left inguinal lymph node measures 13 mm compared with 21 mm previously. No increasing adenopathy. Reproductive: Uterus and bilateral adnexa are unremarkable. Other: Small free fluid in the pelvis. Multiple pockets of free air within the upper abdomen. Larger collection of gas within the right lower mesenteric, series 2, image number 52 with somewhat branching appearance. Musculoskeletal: No acute osseous abnormality. IMPRESSION: 1. Multiple pockets of pneumoperitoneum within the upper abdomen  concerning for hollow viscus perforation, exact source is unclear. The patient has also developed a small bowel obstruction with suspected transition zone in the distal small bowel/lower pelvis. 2. Fluid-filled right colon with peripheral distribution of gas bubbles, raises concern for pneumatosis. A larger gas collection within the right lower pelvic mesentery has a branching configuration raising possibility of mesenteric venous gas ; suggest correlation with lactic acid values 3. Very dilated gallbladder 4. Decreased splenic metastatic lesions which now appear more well-defined. Decreased retroperitoneal, mesenteric, and inguinal adenopathy. Critical Value/emergent results were called by telephone at the time of interpretation on 05/06/2017 at 10:43 pm to Dr. Threasa Beards BELFI , who verbally acknowledged these results. Electronically Signed   By: Donavan Foil M.D.   On: 05/06/2017 22:43   Dg Abd Portable 1v  Result Date: 05/07/2017 CLINICAL DATA:  NG tube placement EXAM: PORTABLE ABDOMEN - 1 VIEW COMPARISON:  CT 05/06/2017 FINDINGS: Esophageal tube tip overlies  the proximal stomach, side-port overlies the distal esophagus. Residual contrast in the collecting system of the kidneys. Dilated loops of small bowel concerning for a bowel obstruction. Gas present in the colon. IMPRESSION: Esophageal tube tip overlies the proximal stomach, side-port projects over distal esophagus, consider advancement by 5-10 cm for more optimal positioning Multiple loops of dilated small bowel consistent with bowel obstruction Electronically Signed   By: Donavan Foil M.D.   On: 05/07/2017 02:20    Assessment: 71 y.o. 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 CT imaging done on admission showed intraperitoneal air concerning for perforated viscus.  1. Sepsis secondary to partial small bowel obstruction and perforation 2. 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 3. AKI 4. History of left leg DVT, on Xarelto 5. Pancytopenia secondary to chemotherapy 6. Severe protein and calorie malnutrition 7. DNR/DNI  Plan:  -she unfortunately developed partial small bowel obstruction and a bowel perforation, not sure if this is related to her cancer, Avastin may contribute to bowel perforation  -I had long discussion with pt and her son. Pt clearly understands she has incurable metastatic lung cancer, and her overall prognosis is poor, especially she now has partial small bowel obstruction and perforation, she will not able to resume chemotherapy if the above condition does not resolves. Even if she underwent surgery and recovers, it will likely take her long time to recover and she would not be able to resume chemotherapy in the near future. -I reviewed her CT scan findings, which showed decreased adenopathy, she likely had  partial response to chemotherapy. -I will discuss with surgery to see if she is a candidate for surgery, I anticipate she has high risk for surgery due to her recent chem and diffusely metastatic cancer. Pt and her son is open to surgery if she has good chance to tolerate the surgery.  -she is DNR/DNI, appreciate palliative care input  -I appreciate the excellent care from the hospitalist team, and I will follow up closely   Truitt Merle, MD 05/07/2017  10:55 PM

## 2017-05-07 NOTE — Progress Notes (Signed)
CRITICAL VALUE ALERT  Critical Value:  WBC 1.3  Date & Time Notied:  05/07/17 0315  Provider Notified: Delrae Sawyers NP

## 2017-05-07 NOTE — Consult Note (Signed)
Consultation Note Date: 05/07/2017   Patient Name: Penny Stewart  DOB: [birth year]  MRN: [id number]  Age / Sex: 71 y.o., female  PCP: Foye Spurling, MD Referring Physician: Rama, Venetia Maxon, MD  Reason for Consultation: Establishing goals of care  HPI/Patient Profile: 71 y.o. female   admitted on 05/06/2017   Clinical Assessment and Goals of Care:  71 y.o. 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  CT abdomen/pelvis showed: 1. Multiple pockets of pneumoperitoneum within the upper abdomen concerning for hollow viscus perforation, exact source is unclear. The patient has also developed a small bowel obstruction with suspected transition zone in the distal small bowel/lower pelvis. 2. Fluid-filled right colon with peripheral distribution of gas bubbles, raises concern for pneumatosis. A larger gas collection within the right lower pelvic mesentery has a branching configuration raising possibility of mesenteric venous gas ; suggest correlation with lactic acid values  3. Very dilated gallbladder 4. Decreased splenic metastatic lesions which now appear more well-defined. Decreased retroperitoneal, mesenteric, and inguinalAdenopathy.  Patient is admitted to the stepdown unit at [facility] under the hospitalist service for perforated viscus, SBO and sepsis. She is on IV Zosyn, NPO.   A palliative consult has been placed for goals of care discussions. Ms Hazell is resting in bed, her son and daughter in law are at the bedside, her husband has gone home to rest. I introduced myself and palliative care as follows: Palliative medicine is specialized medical care for people living with serious illness. It focuses on  providing relief from the symptoms and stress of a serious illness. The goal is to improve quality of life for both the patient and the family.  Patient appears to be in mild distress due to abdominal pain, she has NGT draining dark bilious material, she denies nausea, she is asking for something to drink, complains of dry mouth and throat.   See recommendations below, thank you for the consult.   NEXT OF KIN  husband, lives with husband in [place], [place] Has one son and daughter in [employer].   SUMMARY OF RECOMMENDATIONS    Agree with DNR DNI Morphine IV PRN and agree with other non pain symptom management regimen Likely not a surgical candidate, family aware.  Family would like to discuss with Dr Burr Medico, patient's primary oncologist.  Patient and family aware that our focus is on pain control and for keeping the patient comfortable.  Disposition: will likely need residential hospice, patient and family did not wish to discuss further in my initial meeting, will follow hospital course and continue discussions.  Agree with IV antibiotics for now Patient is asking for sips/ice chips, will defer to surgery/TRH MD.   Thank you for the consult, we will follow along  Code Status/Advance Care Planning:  DNR    Symptom Management:    as above   Palliative Prophylaxis:   Delirium  Protocol   Psycho-social/Spiritual:   Desire for further Chaplaincy support:no  Additional Recommendations: Education on Hospice  Prognosis:   guarded  Discharge Planning: To Be Determined based on hospital course&disease trajectory,? Residential hospice.     Primary Diagnoses: Present on Admission: . Metastatic lung cancer (metastasis from lung to other site), unspecified laterality ([place]) . Left leg DVT ([place]) . Perforation of viscus . Sepsis ([place]) . AKI (acute kidney injury) ([place]) . SBO (small bowel obstruction) ([place])   I have reviewed the medical record, interviewed the patient and family,  and examined the patient. The following aspects are pertinent.  Past Medical History:  Diagnosis Date  . Adenopathy 02/07/2017  . Hypertension   . Hypothyroidism   . Left leg DVT ([place]) 04/15/2017   Social History   Social History  . Marital status: Married    Spouse name: N/A  . Number of children: N/A  . Years of education: N/A   Social History Main Topics  . Smoking status: Former Smoker    Packs/day: 15.00    Years: 0.10    Quit date: 10/28/2014  . Smokeless tobacco: Never Used  . Alcohol use 0.6 oz/week    1 Glasses of wine per week     Comment: 1-2 times a week   . Drug use: No  . Sexual activity: Not Asked   Other Topics Concern  . None   Social History Narrative  . None   Family History  Problem Relation Age of Onset  . Cancer Mother 67       colon cancer   . Cancer Sister 46       breast cancer    Scheduled Meds: . hydrocortisone  5 mg Oral Daily  . levothyroxine  37.5 mcg Intravenous Daily  . sodium chloride flush  3 mL Intravenous Q12H   Continuous Infusions: . sodium chloride 125 mL/hr at 05/07/17 0600  . piperacillin-tazobactam (ZOSYN)  IV Stopped (05/07/17 0835)   PRN Meds:.iopamidol, morphine injection, ondansetron Medications Prior to Admission:  Prior to Admission medications   Medication Sig Start Date End Date Taking? Authorizing Provider  amLODipine (NORVASC) 10 MG tablet Take 10 mg by mouth daily.    Yes [provider]  dexamethasone (DECADRON) 2 MG tablet Starting 05/04/17 take one 2mg  tablet po daily.  Starting Jun 08, 2017 take one 2mg  tablet po every other day.  Stop on 05/19/17. Patient taking differently: Take 2 mg by mouth See admin instructions. Starting on 05/04/17 pt is to take one tablet daily, then starting on Jun 08, 2017 pt is to take one tablet daily on an every other day basis, pt is to STOP on 05/19/17. 04/29/17  Yes Dara Lords, Rex Kras, PA-C  folic acid (FOLVITE) 1 MG tablet Take 1 tablet (1 mg total) by mouth daily. 03/17/17  Yes  Truitt Merle, MD  levothyroxine (SYNTHROID, LEVOTHROID) 75 MCG tablet Take 75 mcg by mouth daily before breakfast.   Yes [provider]  mirtazapine (REMERON) 30 MG tablet Take 30 mg by mouth at bedtime.   Yes [provider]  rivaroxaban (XARELTO) 20 MG TABS tablet Take 1 tablet (20 mg total) by mouth daily with supper. Begin after you complete the starter pack. Patient taking differently: Take 20 mg by mouth daily with breakfast.  04/28/17  Yes Owens Shark, NP  vitamin B-12 (CYANOCOBALAMIN) 1000 MCG tablet Take 1,000 mcg by mouth daily.   Yes [provider]   No Known Allergies Review of Systems + abdominal pain, generalized.  Physical Exam Pale weak appearing elderly lady S1 S2 Abdomen distended diffusely tender, voluntary guarding No edema Lungs shallow clear, patient not able to take deep breaths Awake alert Answers all questions appropriately Noted to be wincing grimacing at times, I have asked her to ask for PRN pain medications when she is hurting.   Vital Signs: BP (!) 105/55 (BP Location: Left Arm)   Pulse 87   Temp 98.3 F (36.8 C) (Oral)   Resp (!) 21   Ht 4' 11.5" (1.511 m)   Wt 43.7 kg (96 lb 5.5 oz)   SpO2 95%   BMI 19.13 kg/m  Pain Assessment: No/denies pain   Pain Score: 0-No pain   SpO2: SpO2: 95 % O2 Device:SpO2: 95 % O2 Flow Rate: .O2 Flow Rate (L/min): 3 L/min  IO: Intake/output summary:  Intake/Output Summary (Last 24 hours) at 05/07/17 0855 Last data filed at 05/07/17 0800  Gross per 24 hour  Intake          3831.25 ml  Output              950 ml  Net          2881.25 ml    LBM:   Baseline Weight: Weight: 43.7 kg (96 lb 5.5 oz) Most recent weight: Weight: 43.7 kg (96 lb 5.5 oz)     Palliative Assessment/Data:   Flowsheet Rows     Most Recent Value  Intake Tab  Referral Department  Hospitalist  Unit at Time of Referral  Intermediate Care Unit  Palliative Care Primary Diagnosis  Cancer  Palliative Care Type   New Palliative care  Reason for referral  Clarify Goals of Care, Pain  Date first seen by Palliative Care  05/07/17  Clinical Assessment  Palliative Performance Scale Score  20%  Pain Max last 24 hours  7  Pain Min Last 24 hours  4  Dyspnea Max Last 24 Hours  3  Dyspnea Min Last 24 hours  2  Nausea Max Last 24 Hours  3  Nausea Min Last 24 Hours  2  Psychosocial & Spiritual Assessment  Palliative Care Outcomes  Patient/Family meeting held?  Yes  Who was at the meeting?  patient son daughter in law  Palliative Care Outcomes  Clarified goals of care      Time In:  7.30 Time Out:  8.40 Time Total:  70 min  Greater than 50%  of this time was spent counseling and coordinating care related to the above assessment and plan.  Signed by: Loistine Chance, MD  [phone]  Please contact Palliative Medicine Team phone at [phone] for questions and concerns.  For individual provider: See Shea Evans

## 2017-05-08 ENCOUNTER — Inpatient Hospital Stay (HOSPITAL_COMMUNITY): Payer: Medicare Other

## 2017-05-08 DIAGNOSIS — E162 Hypoglycemia, unspecified: Secondary | ICD-10-CM | POA: Diagnosis not present

## 2017-05-08 LAB — APTT
APTT: 166 s — AB (ref 24–36)
aPTT: 145 seconds — ABNORMAL HIGH (ref 24–36)

## 2017-05-08 LAB — GLUCOSE, CAPILLARY
GLUCOSE-CAPILLARY: 137 mg/dL — AB (ref 65–99)
GLUCOSE-CAPILLARY: 62 mg/dL — AB (ref 65–99)
GLUCOSE-CAPILLARY: 161 mg/dL — AB (ref 65–99)
Glucose-Capillary: 139 mg/dL — ABNORMAL HIGH (ref 65–99)
Glucose-Capillary: 243 mg/dL — ABNORMAL HIGH (ref 65–99)

## 2017-05-08 LAB — CBC
HCT: 29.4 % — ABNORMAL LOW (ref 36.0–46.0)
Hemoglobin: 9.6 g/dL — ABNORMAL LOW (ref 12.0–15.0)
MCH: 26.4 pg (ref 26.0–34.0)
MCHC: 32.7 g/dL (ref 30.0–36.0)
MCV: 81 fL (ref 78.0–100.0)
PLATELETS: 79 10*3/uL — AB (ref 150–400)
RBC: 3.63 MIL/uL — AB (ref 3.87–5.11)
RDW: 20.3 % — ABNORMAL HIGH (ref 11.5–15.5)
WBC: 2.6 10*3/uL — ABNORMAL LOW (ref 4.0–10.5)

## 2017-05-08 LAB — HEPARIN LEVEL (UNFRACTIONATED): Heparin Unfractionated: 1.06 IU/mL — ABNORMAL HIGH (ref 0.30–0.70)

## 2017-05-08 MED ORDER — SODIUM CHLORIDE 0.9 % IV SOLN
INTRAVENOUS | Status: DC
  Administered 2017-05-08 – 2017-05-09 (×2): via INTRAVENOUS

## 2017-05-08 MED ORDER — DEXTROSE 50 % IV SOLN
INTRAVENOUS | Status: AC
  Filled 2017-05-08: qty 50

## 2017-05-08 MED ORDER — DEXTROSE-NACL 5-0.9 % IV SOLN
INTRAVENOUS | Status: DC
  Administered 2017-05-08 (×3): via INTRAVENOUS

## 2017-05-08 MED ORDER — HEPARIN (PORCINE) IN NACL 100-0.45 UNIT/ML-% IJ SOLN
250.0000 [IU]/h | INTRAMUSCULAR | Status: DC
  Administered 2017-05-08: 300 [IU]/h via INTRAVENOUS

## 2017-05-08 MED ORDER — DEXTROSE-NACL 5-0.9 % IV SOLN
INTRAVENOUS | Status: DC
  Administered 2017-05-08: 19:00:00 via INTRAVENOUS

## 2017-05-08 MED ORDER — DEXTROSE 50 % IV SOLN
25.0000 mL | Freq: Once | INTRAVENOUS | Status: AC
  Administered 2017-05-08: 25 mL via INTRAVENOUS

## 2017-05-08 NOTE — Progress Notes (Signed)
I have examine the patient and reviewed her history and CT scan.  Pt appears to have a malignant bowel obstruction with perforation or perforation due to avastin.  I do not see any reasonable chance of recovery with surgery in her current condition and she probably has a better, although small, chance of recovery with medical management.  Will defer to medical team for additional management issues.    Rosario Adie, MD  [facility]

## 2017-05-08 NOTE — Progress Notes (Signed)
Notified pharmacist to adjust heparin drip based on 0800 PTT results 166.  Awaiting dose adjustment. Received call from Ewa Gentry MD with directions to page hematologist Truitt Merle of PTT result.

## 2017-05-08 NOTE — Care Management Note (Signed)
Case Management Note  Patient Details  Name: Penny Stewart MRN: [id number] Date of Birth: [birth year]  Subjective/Objective:                  cholecystitis  Action/Plan: Date:  May 08, 2017  Chart reviewed for concurrent status and case management needs.  Will continue to follow patient progress.  Discharge Planning: following for needs  Expected discharge date: [id number]  Velva Harman, BSN, [place], [place]   Expected Discharge Date:  05/10/17               Expected Discharge Plan:  Home/Self Care  In-House Referral:     Discharge planning Services  CM Consult  Post Acute Care Choice:    Choice offered to:     DME Arranged:    DME Agency:     HH Arranged:    HH Agency:     Status of Service:  In process, will continue to follow  If discussed at Long Length of Stay Meetings, dates discussed:    Additional Comments:  Leeroy Cha, RN 05/08/2017, 9:04 AM

## 2017-05-08 NOTE — Progress Notes (Signed)
Notified by Raelyn Ensign in lab of critical lab 166 PTT.  MD and pharmacist made aware.

## 2017-05-08 NOTE — Progress Notes (Signed)
Penny Stewart   DOB:1946-01-09   NF#:[id number]   QIO#:[id number]  Oncology follow-up note  Subjective: Pt is more drowsy today, answers questions, denies any significant pain.   Objective:  Vitals:   05/08/17 2200 05/08/17 2300  BP: (!) 99/53 (!) 104/59  Pulse: 88 86  Resp: 19 19  Temp:      Body mass index is 19.13 kg/m.  Intake/Output Summary (Last 24 hours) at 05/08/17 2332 Last data filed at 05/08/17 2300  Gross per 24 hour  Intake          2697.55 ml  Output             2465 ml  Net           232.55 ml     Sclerae unicteric  Oropharynx clear  No peripheral adenopathy  Lungs clear -- no rales or rhonchi  Heart regular rate and rhythm  Abdomen firm, mild diffuse tenderness   MSK no focal spinal tenderness, no peripheral edema  Neuro nonfocal   CBG (last 3)   Recent Labs  05/08/17 1205 05/08/17 1834 05/08/17 2325  GLUCAP 139* 243* 161*     Labs:  Lab Results  Component Value Date   WBC 2.6 (L) 05/08/2017   HGB 9.6 (L) 05/08/2017   HCT 29.4 (L) 05/08/2017   MCV 81.0 05/08/2017   PLT 79 (L) 05/08/2017   NEUTROABS 1.7 05/06/2017   CMP Latest Ref Rng & Units 05/07/2017 05/06/2017 04/28/2017  Glucose 65 - 99 mg/dL 122(H) 146(H) 157(H)  BUN 6 - 20 mg/dL 75(H) 81(H) 13.9  Creatinine 0.44 - 1.00 mg/dL 2.03(H) 2.40(H) 0.7  Sodium 135 - 145 mmol/L 136 136 141  Potassium 3.5 - 5.1 mmol/L 4.0 4.5 3.8  Chloride 101 - 111 mmol/L 96(L) 91(L) -  CO2 22 - 32 mmol/L 29 33(H) 30(H)  Calcium 8.9 - 10.3 mg/dL 6.6(L) 7.6(L) 9.5  Total Protein 6.5 - 8.1 g/dL - 6.5 6.7  Total Bilirubin 0.3 - 1.2 mg/dL - 0.3 0.22  Alkaline Phos 38 - 126 U/L - 91 110  AST 15 - 41 U/L - 50(H) 19  ALT 14 - 54 U/L - 29 43    Urine Studies No results for input(s): UHGB, CRYS in the last 72 hours.  Invalid input(s): UACOL, UAPR, USPG, UPH, UTP, UGL, UKET, UBIL, UNIT, UROB, ULEU, UEPI, UWBC, URBC, UBAC, CAST, UCOM, BILUA  Basic Metabolic Panel:  Recent Labs Lab 05/06/17 1908  05/07/17 0253  NA 136 136  K 4.5 4.0  CL 91* 96*  CO2 33* 29  GLUCOSE 146* 122*  BUN 81* 75*  CREATININE 2.40* 2.03*  CALCIUM 7.6* 6.6*   GFR Estimated Creatinine Clearance: 17.8 mL/min (A) (by C-G formula based on SCr of 2.03 mg/dL (H)). Liver Function Tests:  Recent Labs Lab 05/06/17 1908  AST 50*  ALT 29  ALKPHOS 91  BILITOT 0.3  PROT 6.5  ALBUMIN 2.6*    Recent Labs Lab 05/06/17 1908  LIPASE <10*   No results for input(s): AMMONIA in the last 168 hours. Coagulation profile  Recent Labs Lab 05/07/17 0052  INR 3.98    CBC:  Recent Labs Lab 05/06/17 1908 05/07/17 0253 05/08/17 0642  WBC 2.0* 1.3* 2.6*  NEUTROABS 1.7  --   --   HGB 11.2* 9.9* 9.6*  HCT 34.1* 29.5* 29.4*  MCV 78.8 78.9 81.0  PLT 132* 113* 79*   Cardiac Enzymes: No results for input(s): CKTOTAL, CKMB, CKMBINDEX, TROPONINI in the last 168  hours. BNP: Invalid input(s): POCBNP CBG:  Recent Labs Lab 05/08/17 0736 05/08/17 0821 05/08/17 1205 05/08/17 1834 05/08/17 2325  GLUCAP 62* 137* 139* 243* 161*   D-Dimer No results for input(s): DDIMER in the last 72 hours. Hgb A1c No results for input(s): HGBA1C in the last 72 hours. Lipid Profile No results for input(s): CHOL, HDL, LDLCALC, TRIG, CHOLHDL, LDLDIRECT in the last 72 hours. Thyroid function studies No results for input(s): TSH, T4TOTAL, T3FREE, THYROIDAB in the last 72 hours.  Invalid input(s): FREET3 Anemia work up No results for input(s): VITAMINB12, FOLATE, FERRITIN, TIBC, IRON, RETICCTPCT in the last 72 hours. Microbiology Recent Results (from the past 240 hour(s))  Culture, blood (routine x 2)     Status: None (Preliminary result)   Collection Time: 05/06/17 12:30 AM  Result Value Ref Range Status   Specimen Description BLOOD LEFT ANTECUBITAL  Final   Special Requests   Final    BOTTLES DRAWN AEROBIC AND ANAEROBIC Blood Culture adequate volume   Culture   Final    NO GROWTH 1 DAY Performed at [facility], [address], [place], [place] [address]    Report Status PENDING  Incomplete  Culture, blood (routine x 2)     Status: None (Preliminary result)   Collection Time: 05/07/17 12:52 AM  Result Value Ref Range Status   Specimen Description BLOOD RIGHT ANTECUBITAL  Final   Special Requests   Final    BOTTLES DRAWN AEROBIC AND ANAEROBIC Blood Culture adequate volume   Culture   Final    NO GROWTH 1 DAY Performed at [facility], [place] [address] [place], [place] [address]    Report Status PENDING  Incomplete  MRSA PCR Screening     Status: None   Collection Time: 05/07/17  1:42 AM  Result Value Ref Range Status   MRSA by PCR NEGATIVE NEGATIVE Final    Comment:        The GeneXpert MRSA Assay (FDA approved for NASAL specimens only), is one component of a comprehensive MRSA colonization surveillance program. It is not intended to diagnose MRSA infection nor to guide or monitor treatment for MRSA infections.       Studies:  Dg Abd Portable 1v  Result Date: 05/08/2017 CLINICAL DATA:  Nasogastric tube placement EXAM: PORTABLE ABDOMEN - 1 VIEW COMPARISON:  Yesterday FINDINGS: Nasogastric tube tip over the proximal stomach with side port at the GE junction. Ongoing small bowel distention from ileus or obstruction based on prior CT. IMPRESSION: Nasogastric tube tip is over the proximal stomach. Electronically Signed   By: Monte Fantasia M.D.   On: 05/08/2017 12:25   Dg Abd Portable 1v  Result Date: 05/07/2017 CLINICAL DATA:  NG tube placement EXAM: PORTABLE ABDOMEN - 1 VIEW COMPARISON:  CT 05/06/2017 FINDINGS: Esophageal tube tip overlies the proximal stomach, side-port overlies the distal esophagus. Residual contrast in the collecting system of the kidneys. Dilated loops of small bowel concerning for a bowel obstruction. Gas present in the colon. IMPRESSION: Esophageal tube tip overlies the proximal stomach, side-port projects over distal esophagus, consider advancement  by 5-10 cm for more optimal positioning Multiple loops of dilated small bowel consistent with bowel obstruction Electronically Signed   By: Donavan Foil M.D.   On: 05/07/2017 02:20    Assessment: 71 y.o. 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 CT imaging  done on admission showed intraperitoneal air concerning for perforated viscus.  1. Sepsis secondary to partial small bowel obstruction and perforation 2. 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 3. AKI 4. History of left leg DVT, on Xarelto 5. Pancytopenia secondary to chemotherapy 6. Severe protein and calorie malnutrition 7. DNR/DNI  Plan:  -I very much appreciate Dr. Manon Hilding input, she is not a candidate for surgery.   -I have discussed with pt and her son again about her extremely poor prognosis. Given the partial small bowel obstruction and perforation, sepsis, she is unlikely going to recover, certainly not a candidate for more chemotherapy. -I recommend comfort care and hospice. Patient and her family agree. I think she is probably a candidate for residential hospice.  -Appreciated palliative care Dr. Inda Castle input and help for her transition to hospice  -I will follow up as needed   Truitt Merle, MD 05/08/2017  11:32 PM

## 2017-05-08 NOTE — Progress Notes (Signed)
CRITICAL VALUE ALERT  Critical Value:  166 PTT  Date & Time Notied: 05/08/17   Provider Notified: Rama, MD 0800  Orders Received/Actions taken: MD aware

## 2017-05-08 NOTE — Progress Notes (Addendum)
Progress Note    Penny Stewart  HWT:[id number] DOB: 1946-04-09  DOA: 05/06/2017 PCP: Foye Spurling, MD    Brief Narrative:   Chief complaint: Follow-up nausea, vomiting, abdominal pain secondary to perforated viscus  Medical records reviewed and are as summarized below:  Penny Stewart is an 71 y.o. female with a PMH of hypertension, hyperlipidemia, hypothyroidism, depression, left leg DVT on Xarelto, and metastatic lung cancer who was admitted 05/06/17 for evaluation of nausea, vomiting and abdominal pain. CT imaging done on admission showed intraperitoneal air concerning for perforated viscus.  Assessment/Plan:   Principal Problem:   Sepsis secondary to Perforation of viscus and small bowel obstruction Not felt to be a good candidate for surgery. Continue empiric Zosyn. BP stable but on the low side. Continue NG tube for gastric decompression. Palliative care consulted, and has made recommendations for comfort. Family discussed plan of care with her oncologist 05/07/17. No plans for surgery yet, but no formal consult note from surgeon. Dr. Burr Medico indicated she would discuss the option of surgery directly with the surgeons.  Active Problems:   Metastatic lung cancer (metastasis from lung to other site), unspecified laterality (HCC)/brain metastasis Status post radiation to the brain and has been receiving chemotherapy. On IV Cortef. Poor overall prognosis given admitting problem and it appears that she is no longer a candidate for further chemo.    Left leg DVT (HCC) On IV heparin. PTT markedly elevated this morning, so heparin placed on hold.    AKI (acute kidney injury) ([place])  Acute kidney injury likely prerenal from sepsis and hypotension. Re-check creatinine 05/09/17.    Pancytopenia Likely chemotherapy-induced. WBC improved but platelets lower today.  Hgb stable.    Severe protein calorie malnutrition/underweight Likely related to cachexia from cancer. Body mass  index is 19.13 kg/m.    Hypoglycemia Glucose 62 this morning. Add dextrose to IVF.   Family Communication/Anticipated D/C date and plan/Code Status   DVT prophylaxis: Heparin ordered. Code Status: DO NOT RESUSCITATE.  Family Communication: Children updated at the bedside 05/07/17, no family present today. Disposition Plan: Likely will need residential hospice.   Medical Consultants:    General Surgery   Anti-Infectives:    Zosyn 05/06/17--->  Subjective:   Seems weaker today. Denies pain, SOB, nausea.  Objective:    Vitals:   05/08/17 0500 05/08/17 0600 05/08/17 0700 05/08/17 0800  BP: (!) 103/57 (!) 96/55 (!) 95/49 (!) 99/45  Pulse: 89 96 93 89  Resp: 20 (!) 27 18 (!) 21  Temp:      TempSrc:      SpO2: 95% 94% 95% 93%  Weight:      Height:        Intake/Output Summary (Last 24 hours) at 05/08/17 0835 Last data filed at 05/08/17 0820  Gross per 24 hour  Intake          3050.08 ml  Output             2755 ml  Net           295.08 ml   Filed Weights   05/07/17 0115  Weight: 43.7 kg (96 lb 5.5 oz)    Exam: General exam: Frail elderly female lying quietly in bed. NG tube right nare draining bilious colored fluid. Respiratory system: Lungs are diminished. Cardiovascular system: Heart sounds are mildly irregular.  No murmurs, rubs or gallops. Gastrointestinal system: Abdomen is distended, winces when I palpate her abdomen. Central nervous system: More sleepy  today, but conversant, non-focal. Extremities: No C/E/C Skin: No rash, warm and dry. Psychiatry: Mood and affect depressed/flat  Data Reviewed:   I have personally reviewed following labs and imaging studies:  Labs: Labs show the following: WBC 2.6, hemoglobin 9.6, platelets 79. INR 3.98. PTT 166.  Procedures and diagnostic studies:  Ct Abdomen Pelvis W Contrast 05/06/2017: Pneumoperitoneum within the upper abdomen concerning for perforated viscus; small bowel obstruction with suspected  transition zone in the distal small bowel/lower pelvis. Dilated gallbladder.   Dg Abd Portable 1v: 05/07/2017: NG tube in the stomach.  Medications:   . hydrocortisone sod succinate (SOLU-CORTEF) inj  25 mg Intravenous Daily  . levothyroxine  37.5 mcg Intravenous Daily  . sodium chloride flush  3 mL Intravenous Q12H   Continuous Infusions: . sodium chloride 125 mL/hr at 05/08/17 0700  . piperacillin-tazobactam (ZOSYN)  IV Stopped (05/08/17 0554)    Medical decision making is of high complexity and this patient is at high risk of deterioration, therefore this is a level 3 visit.  (> 4 problem points, 1 data point, high risk: Need 2 out of 3)   Problems/DDx Points   Self limited or minor (max 2)       1   2  Established problem, stable       1   1  Established problem, worsening       2    New problem, no additional W/U planned (max 1) hypoglycemia       3   3  New problem, additional W/U planned        4    Data Reviewed Points   Review/order clinical lab tests       1   1  Review/order x-rays       1   Review/order tests (Echo, EKG, PFTs, etc)       1   Discussion of test results w/ performing MD       1   Independent review of image, tracing or specimen       2     Decision to obtain old records       1   Review and summation of old records       2      Level of risk Presenting prob Diagnostics Management   Minimal 1 self limited/minor Labs CXR EKG/EEG U/A U/S Rest Gargles Bandages Dressings   Low 2 or more self limited/minor 1 stable chronic Acute uncomplicated illness Tests (PFTS) Non-CV imaging Arterial labs Biopsies of skin OTC drugs Minor surgery-no risk PT OT IVF without additives    Moderate 1 or more chronic illnesses w/ mild exac, progression or S/E from tx 2 or more stable chronic illnesses Undiagnosed new problem w/ uncertain prognosis Acute complicated injury  Stress tests Endoscopies with no risk factors Deep needle or incisional bx CV  imaging without risk LP Thoracentesis Paracentesis Minor surgery w/ risks Elective major surgery w/ no risk (open, percutaneous or endoscopic) Prescription drugs Therapeutic nucl med IVF with additives Closed tx of fracture/dislocation    High Severe exac of chronic illness Acute or chronic illness/injury may pose a threat to life or bodily function (ARF) Change in neuro status    CV imaging w/ contrast and risk Cardio electophysiologic tests Endoscopies w/ risk Discography Elective major surgery Emergency major surgery Parenteral controlled substances Drug therapy req monitoring for toxicity DNR/de-escalation of care    MDM Prob points Data points Risk   Straightforward    <  1    <1    Min   Low complexity    2    2    Low   Moderate    3    3    Mod   High Complexity    4 or more    4 or more    High      LOS: 1 day   Tereso Unangst  [facility] Pager [phone]. If unable to reach me by pager, please call my cell phone at [phone].  *Please refer to [email or web address], password [email or web address] to get updated schedule on who will round on this patient, as hospitalists switch teams weekly. If 7PM-7AM, please contact night-coverage at [email or web address], password [email or web address] for any overnight needs.  05/08/2017, 8:35 AM

## 2017-05-08 NOTE — Progress Notes (Signed)
Daily Progress Note   Patient Name: Penny Stewart       Date: 05/08/2017 DOB: Jul 29, 1946  Age: 71 y.o. MRN#: [id number] Attending Physician: Tonye Royalty, MD Primary Care Physician: Foye Spurling, MD Admit Date: 05/06/2017  Reason for Consultation/Follow-up: Establishing goals of care  Subjective:  awake alert, appears to have some pain in abdomen at times.  Son at bedside See below   Length of Stay: 1  Current Medications: Scheduled Meds:  . hydrocortisone sod succinate (SOLU-CORTEF) inj  25 mg Intravenous Daily  . levothyroxine  37.5 mcg Intravenous Daily  . sodium chloride flush  3 mL Intravenous Q12H    Continuous Infusions: . dextrose 5 % and 0.9% NaCl 125 mL/hr at 05/08/17 1111  . heparin 300 Units/hr (05/08/17 1113)  . piperacillin-tazobactam (ZOSYN)  IV 2.25 g (05/08/17 1342)    PRN Meds: iopamidol, morphine injection, ondansetron  Physical Exam         Awake alert  Lungs clear S1 S2 Abdomen still firm with moderate distension No edema Awake alert non oriented  Vital Signs: BP 91/66   Pulse 94   Temp 98.3 F (36.8 C) (Oral)   Resp (!) 28   Ht 4' 11.5" (1.511 m)   Wt 43.7 kg (96 lb 5.5 oz)   SpO2 96%   BMI 19.13 kg/m  SpO2: SpO2: 96 % O2 Device: O2 Device: Nasal Cannula O2 Flow Rate: O2 Flow Rate (L/min): 4 L/min  Intake/output summary:  Intake/Output Summary (Last 24 hours) at 05/08/17 1607 Last data filed at 05/08/17 0820  Gross per 24 hour  Intake          2040.33 ml  Output              870 ml  Net          1170.33 ml   LBM:   Baseline Weight: Weight: 43.7 kg (96 lb 5.5 oz) Most recent weight: Weight: 43.7 kg (96 lb 5.5 oz)       Palliative Assessment/Data:    Flowsheet Rows     Most Recent Value  Intake Tab  Referral  Department  Hospitalist  Unit at Time of Referral  Intermediate Care Unit  Palliative Care Primary Diagnosis  Cancer  Palliative Care Type  New Palliative care  Reason for referral  Clarify Goals of Care, Pain  Date first seen by Palliative Care  05/07/17  Clinical Assessment  Palliative Performance Scale Score  20%  Pain Max last 24 hours  7  Pain Min Last 24 hours  4  Dyspnea Max Last 24 Hours  3  Dyspnea Min Last 24 hours  2  Nausea Max Last 24 Hours  3  Nausea Min Last 24 Hours  2  Psychosocial & Spiritual Assessment  Palliative Care Outcomes  Patient/Family meeting held?  Yes  Who was at the meeting?  patient son daughter in law  Palliative Care Outcomes  Clarified goals of care      Patient Active Problem List   Diagnosis Date Noted  . Hypoglycemia without diagnosis of diabetes mellitus 05/08/2017  . SBO (small bowel obstruction) ([place]) 05/07/2017  . Perforation of viscus 05/06/2017  . Sepsis ([place]) 05/06/2017  . AKI (acute kidney injury) ([place]) 05/06/2017  . Hypertension   . Hypothyroidism   . Left leg DVT ([place]) 04/15/2017  . Brain metastases ([place]) 03/13/2017  . Goals of care, counseling/discussion 03/01/2017  . Metastatic lung cancer (metastasis from lung to other site), unspecified laterality ([place]) 02/27/2017  . Adenopathy 02/07/2017    Palliative Care Assessment & Plan   Patient Profile:    Assessment:  metastatic lung cancer Abdominal pain Partial SBO and perforation   Recommendations/Plan:  Patient encouraged to ask for Morphine IV PRN on an as needed basis Is on sips and ice chips NG still draining, reportedly had a BM earlier today Briefly discussed with son outside the room, gave him some more information on hospice, continue to follow along, follow hospital course and help guide decision making. Appreciate Dr Ernestina Penna input and surgery recommendations as well.      Code Status:    Code Status Orders        Start     Ordered   05/07/17 0011   Do not attempt resuscitation (DNR)  Continuous    Question Answer Comment  In the event of cardiac or respiratory ARREST Do not call a "code blue"   In the event of cardiac or respiratory ARREST Do not perform Intubation, CPR, defibrillation or ACLS   In the event of cardiac or respiratory ARREST Use medication by any route, position, wound care, and other measures to relive pain and suffering. May use oxygen, suction and manual treatment of airway obstruction as needed for comfort.      05/07/17 0011    Code Status History    Date Active Date Inactive Code Status Order ID Comments User Context   This patient has a current code status but no historical code status.       Prognosis:   guarded.   Discharge Planning:  To Be Determined  Care plan was discussed with  Patient, son.  Thank you for allowing the Palliative Medicine Team to assist in the care of this patient.   Time In: 1500 Time Out: 1525 Total Time 25 Prolonged Time Billed  no       Greater than 50%  of this time was spent counseling and coordinating care related to the above assessment and plan.  Loistine Chance, MD [phone]  Please contact Palliative Medicine Team phone at [phone] for questions and concerns.

## 2017-05-08 NOTE — Progress Notes (Signed)
Brief Pharmacy Note re: Heparin  For complete details see pharmacy note from earlier today by A. Runyon, PharmD.  O:  APTT: 145 sec on 300units/hr (goal 66-102 sec)  A/P:  APTT remains elevated, although noted baseline aPTT~80sec (within therapeutic range) so will target upper end of goal range & decrease only slightly to 250 units/hr. Recheck 8hr aPTT with morning labs.  Heparin level & CBC will be checked at this time also.  If no plans for surgery, consider change heparin to Lovenox 40mg  sq q24h (therapeutic dose based on weight/renal function)  Netta Cedars, PharmD, BCPS 05/08/2017@8 :14 PM

## 2017-05-08 NOTE — Progress Notes (Signed)
ANTICOAGULATION CONSULT NOTE - Follow Up Consult  Pharmacy Consult for heparin Indication: hx DVT  No Known Allergies  Patient Measurements: Height: 4' 11.5" (151.1 cm) Weight: 96 lb 5.5 oz (43.7 kg) IBW/kg (Calculated) : 44.35 Heparin Dosing Weight: 43 kg  Vital Signs: Temp: 99.1 F (37.3 C) (07/12 0800) Temp Source: Axillary (07/12 0800) BP: 99/45 (07/12 0800) Pulse Rate: 89 (07/12 0800)  Labs:  Recent Labs  05/06/17 1908  05/07/17 0052 05/07/17 0253 05/07/17 0827 05/07/17 1930 05/08/17 0642  HGB 11.2*  --   --  9.9*  --   --  9.6*  HCT 34.1*  --   --  29.5*  --   --  29.4*  PLT 132*  --   --  113*  --   --  79*  APTT  --   < > 81*  --  72* >200* 166*  LABPROT  --   --  39.8*  --   --   --   --   INR  --   --  3.98  --   --   --   --   HEPARINUNFRC  --   --  >2.20*  --   --   --  1.06*  CREATININE 2.40*  --   --  2.03*  --   --   --   < > = values in this interval not displayed.  Estimated Creatinine Clearance: 17.8 mL/min (A) (by C-G formula based on SCr of 2.03 mg/dL (H)).   Medications:  xarelto 20 mg daily PTA (LD on 7/10 at 0800)  Assessment: Patient is a 71 y.o F with hx metastatic lung cancer currently undergoing chemotherapy treatment and hx DVT (04/15/2017) on xarelto PTA, presented to the ED on 05/06/17 with c/o weakness and n/v.  Abd CT showed SBO.  To transition patient to heparin drip while she's NPO.  Significant events:   Baseline aPTT elevated at 72 sec (~24 hrs after last dose of xarelto).   Baseline heparin level and INR elevated on admission but this is d/t effect of xarelto  Today, 05/08/2017: - aPTT decreased but remains elevated at 166 seconds despite low rate of heparin infusion at 400 units/h.  Difficult to interpret in light of elevated baseline aPTT and anti-Xa level not helpful with rivaroxaban use and expect prolonged elimination d/t poor renal function.  Discussed elevated aPTT and anticoagulation options with patient's hematologist,  Dr. Burr Medico.  With surgery still a possibility, recommends to continue heparin versus any longer acting agents (i.e., Lovenox).  Per our discussion, will continue heparin infusion at low rate.  Platelets decreasing following recent chemotherapy.  Dr. Burr Medico recommends holding anticoagulation if platelets <50K. - No bleeding documented - AKI, SCr 2.03 (CrCl~18 ml/min)  Goal of Therapy:  Heparin level 0.3-0.7 units/ml aPTT 66-102 seconds Monitor platelets by anticoagulation protocol: Yes   Plan:  - Hold heparin x 1 hr and reduce rate to 300 units/hr - Check 8 hr aPTT - Daily CBC and heparin level - Anticipate prolonged rivaroxaban elimination d/t AKI - Monitor for s/s bleeding  Hershal Coria, PharmD, BCPS Pager: [phone] 05/08/2017 9:26 AM

## 2017-05-09 DIAGNOSIS — I9589 Other hypotension: Secondary | ICD-10-CM

## 2017-05-09 DIAGNOSIS — E876 Hypokalemia: Secondary | ICD-10-CM

## 2017-05-09 DIAGNOSIS — D61818 Other pancytopenia: Secondary | ICD-10-CM

## 2017-05-09 LAB — BASIC METABOLIC PANEL
ANION GAP: 8 (ref 5–15)
BUN: 49 mg/dL — ABNORMAL HIGH (ref 6–20)
CALCIUM: 5.6 mg/dL — AB (ref 8.9–10.3)
CO2: 31 mmol/L (ref 22–32)
Chloride: 112 mmol/L — ABNORMAL HIGH (ref 101–111)
Creatinine, Ser: 0.73 mg/dL (ref 0.44–1.00)
Glucose, Bld: 133 mg/dL — ABNORMAL HIGH (ref 65–99)
Potassium: 2.4 mmol/L — CL (ref 3.5–5.1)
SODIUM: 151 mmol/L — AB (ref 135–145)

## 2017-05-09 LAB — CBC
HCT: 26.7 % — ABNORMAL LOW (ref 36.0–46.0)
Hemoglobin: 8.5 g/dL — ABNORMAL LOW (ref 12.0–15.0)
MCH: 25.8 pg — ABNORMAL LOW (ref 26.0–34.0)
MCHC: 31.8 g/dL (ref 30.0–36.0)
MCV: 81.2 fL (ref 78.0–100.0)
PLATELETS: 48 10*3/uL — AB (ref 150–400)
RBC: 3.29 MIL/uL — ABNORMAL LOW (ref 3.87–5.11)
RDW: 20.4 % — AB (ref 11.5–15.5)
WBC: 3.6 10*3/uL — AB (ref 4.0–10.5)

## 2017-05-09 LAB — APTT: aPTT: 98 seconds — ABNORMAL HIGH (ref 24–36)

## 2017-05-09 LAB — GLUCOSE, CAPILLARY
GLUCOSE-CAPILLARY: 128 mg/dL — AB (ref 65–99)
GLUCOSE-CAPILLARY: 97 mg/dL (ref 65–99)

## 2017-05-09 LAB — HEPARIN LEVEL (UNFRACTIONATED): HEPARIN UNFRACTIONATED: 0.42 [IU]/mL (ref 0.30–0.70)

## 2017-05-09 MED ORDER — PIPERACILLIN-TAZOBACTAM 3.375 G IVPB
3.3750 g | Freq: Three times a day (TID) | INTRAVENOUS | Status: DC
  Administered 2017-05-09: 3.375 g via INTRAVENOUS
  Filled 2017-05-09: qty 50

## 2017-05-09 MED ORDER — POTASSIUM CHLORIDE 10 MEQ/100ML IV SOLN
10.0000 meq | INTRAVENOUS | Status: AC
  Administered 2017-05-09 (×6): 10 meq via INTRAVENOUS
  Filled 2017-05-09 (×5): qty 100

## 2017-05-09 MED ORDER — SODIUM CHLORIDE 0.9 % IV BOLUS (SEPSIS)
250.0000 mL | Freq: Once | INTRAVENOUS | Status: AC
  Administered 2017-05-09: 250 mL via INTRAVENOUS

## 2017-05-09 MED ORDER — MORPHINE SULFATE (PF) 2 MG/ML IV SOLN
1.0000 mg | INTRAVENOUS | Status: DC
  Administered 2017-05-09 (×3): 1 mg via INTRAVENOUS
  Filled 2017-05-09 (×3): qty 1

## 2017-05-09 MED ORDER — ONDANSETRON 4 MG PO TBDP: 4.0000 mg | ORAL_TABLET | Freq: Three times a day (TID) | ORAL | 0 refills | Status: AC | PRN

## 2017-05-09 MED ORDER — MORPHINE SULFATE (PF) 2 MG/ML IV SOLN: 1.0000 mg | INTRAVENOUS | 0 refills | Status: AC

## 2017-05-09 NOTE — Progress Notes (Signed)
Pharmacy Antibiotic Note  Penny Stewart is a 71 y.o. female admitted on 05/06/2017 with intra-abdominal infection.  Pharmacy has been consulted for Zosyn dosing.    Day #3 Zosyn.  AKI on admission has resolved, warranting dose adjustment.  Plan: Adjust Zosyn to 3.375g IV q8h (4 hour infusion time).   Height: 4' 11.5" (151.1 cm) Weight: 96 lb 5.5 oz (43.7 kg) IBW/kg (Calculated) : 44.35  Temp (24hrs), Avg:98.5 F (36.9 C), Min:97.8 F (36.6 C), Max:99.1 F (37.3 C)   Recent Labs Lab 05/06/17 1908 05/06/17 2310 05/07/17 0052 05/07/17 0253 05/07/17 0827 05/08/17 0642 05/09/17 0459  WBC 2.0*  --   --  1.3*  --  2.6* 3.6*  CREATININE 2.40*  --   --  2.03*  --   --  0.73  LATICACIDVEN  --  2.65* 2.4* 3.4* 1.6  --   --     Estimated Creatinine Clearance: 45.1 mL/min (by C-G formula based on SCr of 0.73 mg/dL).    No Known Allergies  Antimicrobials this admission:  7/11 zosyn>>  Dose adjustments this admission:   Microbiology results:  7/11 BCx x2: ngtd 7/11 MRSA PCR:neg  Thank you for allowing pharmacy to be a part of this patient's care.  Hershal Coria 05/09/2017 7:28 AM

## 2017-05-09 NOTE — Progress Notes (Signed)
Date: May 09, 2017 Chart reviewed for discharge orders: None found for case management. Vernia Buff, [phone]

## 2017-05-09 NOTE — Progress Notes (Signed)
Brief Pharmacy Note re: Heparin  For complete details see pharmacy note from 7/13 by Gaylan Gerold, PharmD.  Assessment:   APTT: 98 sec on 250 units/hr (goal 66-102 sec) Plts = 48.    Plan:  Heparin was held d/t plts=48  Thanks,  Dorrene German 05/09/2017 6:06 AM

## 2017-05-09 NOTE — Progress Notes (Signed)
CRITICAL VALUE ALERT  Critical Value:  Potassium-2.4 and Calcium-5.6  Date & Time Notied:  7/13   0558  Provider Notified: K.Kirby,Np  Orders Received/Actions taken: orders received

## 2017-05-09 NOTE — Discharge Summary (Addendum)
Physician Discharge Summary  Penny Stewart SAY:[id number] DOB: 27-Aug-1946 DOA: 05/06/2017  PCP: Foye Spurling, MD  Admit date: 05/06/2017 Discharge date: 05/09/2017  Admitted From: Home Disposition: Residential hospice  Recommendations for Outpatient Follow-up:  1. Discharged to residential hospice area   Equipment/Devices: NG tube, Foley catheter  Discharge Condition: Guarded CODE STATUS: DO NOT RESUSCITATE Diet recommendation: Clear liquid, advance diet to comfort   Discharge Diagnoses:  Principal Problem: Sepsis(HCC)  Active Problems:   Metastatic lung cancer (metastasis from lung to other site), unspecified laterality (HCC)   Left leg DVT ([place])   Perforation of viscus   AKI (acute kidney injury) ([place])   SBO (small bowel obstruction) ([place])   Hypoglycemia without diagnosis of diabetes mellitus   Brief narrative/history of present illness Please refer to admission H&P for details, in brief, 71 year old female with hypertension, hyperlipidemia, hypothyroidism, depression, left leg DVT on Xarelto metastatic lung cancer admitted for nausea, vomiting and abdominal pain with findings of sepsis and CT abdomen showing perforated viscus. Patient admitted to stepdown monitoring. Given poor candidate for surgery and underlying comorbidity palliative care was consulted and hold him for comfort.  Hospital course Sepsis secondary to Perforation of viscus and small bowel obstruction Surgery consult appreciated. Not felt a candidate for surgery given her underlying comorbidities. NG tube placed for decompression. Placed on empiric IV Zosyn. Blood cultures negative. Blood pressure remains low and receiving IV fluid boluses. Palliative care and oncology consult appreciated. Patient and family understand her guarded prognosis and him for full comfort measures. She will be discharged with the NG tube for comfort. Added scheduled morphine for pain and Zofran for nausea.  Family interested  in her going to residential hospice.  Active Problems:   Metastatic lung cancer (metastasis from lung to other site), unspecified laterality (HCC)/brain metastasis Status post radiation to the brain and has been receiving chemotherapy. Overall prognosis. Not a candidate for further chemotherapy. Oncology consult appreciated. Now more for comfort.    Left leg DVT (HCC) Placed on IV heparin. Discontinue further anticoagulation.    AKI (acute kidney injury) ([place])   prerenal secondary to sepsis and hypertension.    Pancytopenia Likely chemotherapy-induced.     Severe protein calorie malnutrition/underweight Associated with malignancy.    Hypoglycemia   Severe hypokalemia/hyponatremia goal for comfort.  Family communication: Family at bedside and all questions answered.  Disposition: Residential hospice ([facility])  Procedure: CT abdomen  Consults: Oncology General surgery Palliative care   Discharge Instructions   Allergies as of 05/09/2017   No Known Allergies     Medication List    STOP taking these medications   amLODipine 10 MG tablet Commonly known as:  NORVASC   aspirin 81 MG chewable tablet   atorvastatin 20 MG tablet Commonly known as:  LIPITOR   dexamethasone 2 MG tablet Commonly known as:  DECADRON   folic acid 1 MG tablet Commonly known as:  FOLVITE   HYDROcodone-acetaminophen 5-325 MG tablet Commonly known as:  NORCO/VICODIN   levothyroxine 75 MCG tablet Commonly known as:  SYNTHROID, LEVOTHROID   mirtazapine 30 MG tablet Commonly known as:  REMERON   prochlorperazine 10 MG tablet Commonly known as:  COMPAZINE   rivaroxaban 20 MG Tabs tablet Commonly known as:  XARELTO   vitamin B-12 1000 MCG tablet Commonly known as:  CYANOCOBALAMIN     TAKE these medications   morphine 2 MG/ML injection Inject 0.5 mLs (1 mg total) into the vein every 3 (three) hours.   ondansetron 4  MG disintegrating tablet Commonly known as:   ZOFRAN ODT Take 1 tablet (4 mg total) by mouth every 8 (eight) hours as needed for nausea or vomiting. What changed:  medication strength  how much to take  when to take this  reasons to take this      Follow-up Information    Residential hospice Follow up.          No Known Allergies    Procedures/Studies: Ct Abdomen Pelvis W Contrast  Result Date: 05/06/2017 CLINICAL DATA:  Stomach cancer nausea and vomiting EXAM: CT ABDOMEN AND PELVIS WITH CONTRAST TECHNIQUE: Multidetector CT imaging of the abdomen and pelvis was performed using the standard protocol following bolus administration of intravenous contrast. CONTRAST:  78mL ISOVUE-300 IOPAMIDOL (ISOVUE-300) INJECTION 61%, 43mL ISOVUE-300 IOPAMIDOL (ISOVUE-300) INJECTION 61% COMPARISON:  Head CT 02/27/2017, CT abdomen pelvis 02/07/2017 FINDINGS: Lower chest: Lung bases demonstrate no acute consolidation or pleural effusion. The heart is nonenlarged. Mild emphysema. Hepatobiliary: Stable subcentimeter hypodensity in the central liver. No new hepatic lesions. Markedly dilated gallbladder measuring up to 6.2 cm. No calcified stones. No biliary dilatation. Pancreas: Unremarkable. No pancreatic ductal dilatation or surrounding inflammatory changes. Spleen: Multiple hypodense metastatic lesions in the spleen, these now appear more circumscribed and well-defined. Decreased size of a dominant index lesion in the posterior spleen measuring 2.9 cm compared with 3.1 cm previously. Adrenals/Urinary Tract: Adrenal glands are within normal limits. Stable large cyst in the mid to lower right kidney measuring approximately 5.2 cm. Bladder unremarkable. Stomach/Bowel: The stomach is dilated. Multiple loops of fluid-filled dilated small bowel measuring up to 3.8 cm. Suspected transition zone in the distal small bowel/ lower pelvis, series 2, image number 64, findings consistent with mechanical small bowel obstruction. Diffuse fluid-filled right colon with  some peripheral distribution of gas bubbles. No significant colon wall thickening. Normal appendix. Vascular/Lymphatic: Aortic atherosclerosis. Decreased retroperitoneal adenopathy, for example right para aortic lymph node measures 11 mm, compared with 15 mm previously. A left para-aortic lymph node measures 14 mm compare with 17 mm previously. Central mesenteric pelvic nodules are also decreased. There is decreased inguinal adenopathy, for example left inguinal lymph node measures 13 mm compared with 21 mm previously. No increasing adenopathy. Reproductive: Uterus and bilateral adnexa are unremarkable. Other: Small free fluid in the pelvis. Multiple pockets of free air within the upper abdomen. Larger collection of gas within the right lower mesenteric, series 2, image number 52 with somewhat branching appearance. Musculoskeletal: No acute osseous abnormality. IMPRESSION: 1. Multiple pockets of pneumoperitoneum within the upper abdomen concerning for hollow viscus perforation, exact source is unclear. The patient has also developed a small bowel obstruction with suspected transition zone in the distal small bowel/lower pelvis. 2. Fluid-filled right colon with peripheral distribution of gas bubbles, raises concern for pneumatosis. A larger gas collection within the right lower pelvic mesentery has a branching configuration raising possibility of mesenteric venous gas ; suggest correlation with lactic acid values 3. Very dilated gallbladder 4. Decreased splenic metastatic lesions which now appear more well-defined. Decreased retroperitoneal, mesenteric, and inguinal adenopathy. Critical Value/emergent results were called by telephone at the time of interpretation on 05/06/2017 at 10:43 pm to Dr. Threasa Beards BELFI , who verbally acknowledged these results. Electronically Signed   By: Donavan Foil M.D.   On: 05/06/2017 22:43   Dg Abd Portable 1v  Result Date: 05/08/2017 CLINICAL DATA:  Nasogastric tube placement EXAM:  PORTABLE ABDOMEN - 1 VIEW COMPARISON:  Yesterday FINDINGS: Nasogastric tube tip over the proximal stomach with  side port at the GE junction. Ongoing small bowel distention from ileus or obstruction based on prior CT. IMPRESSION: Nasogastric tube tip is over the proximal stomach. Electronically Signed   By: Monte Fantasia M.D.   On: 05/08/2017 12:25   Dg Abd Portable 1v  Result Date: 05/07/2017 CLINICAL DATA:  NG tube placement EXAM: PORTABLE ABDOMEN - 1 VIEW COMPARISON:  CT 05/06/2017 FINDINGS: Esophageal tube tip overlies the proximal stomach, side-port overlies the distal esophagus. Residual contrast in the collecting system of the kidneys. Dilated loops of small bowel concerning for a bowel obstruction. Gas present in the colon. IMPRESSION: Esophageal tube tip overlies the proximal stomach, side-port projects over distal esophagus, consider advancement by 5-10 cm for more optimal positioning Multiple loops of dilated small bowel consistent with bowel obstruction Electronically Signed   By: Donavan Foil M.D.   On: 05/07/2017 02:20       Subjective: Complains of pain in her abdomen.  Discharge Exam: Vitals:   05/09/17 0800 05/09/17 1200  BP:    Pulse:    Resp:    Temp: 97.8 F (36.6 C) 97.7 F (36.5 C)   Vitals:   05/09/17 0600 05/09/17 0705 05/09/17 0800 05/09/17 1200  BP: (!) 88/45 (!) 95/51    Pulse: 79 80    Resp: 15 19    Temp:   97.8 F (36.6 C) 97.7 F (36.5 C)  TempSrc:   Oral Oral  SpO2: 95% 97%    Weight:      Height:       Gen.: Elderly cachectic female, fatigued HEENT: Dry mucosa, NG in place, pallor present, supple neck Chest: Clear bilaterally   CVS: Normal S1 and S2, no murmurs GI: Mild distention, diffuse tenderness, sluggish bowel sounds, 40+ Musculoskeletal: Warm, no edema     The results of significant diagnostics from this hospitalization (including imaging, microbiology, ancillary and laboratory) are listed below for reference.      Microbiology: Recent Results (from the past 240 hour(s))  Culture, blood (routine x 2)     Status: None (Preliminary result)   Collection Time: 05/06/17 12:30 AM  Result Value Ref Range Status   Specimen Description BLOOD LEFT ANTECUBITAL  Final   Special Requests   Final    BOTTLES DRAWN AEROBIC AND ANAEROBIC Blood Culture adequate volume   Culture   Final    NO GROWTH 2 DAYS Performed at [facility], [address], [place], [place] [address]    Report Status PENDING  Incomplete  Culture, blood (routine x 2)     Status: None (Preliminary result)   Collection Time: 05/07/17 12:52 AM  Result Value Ref Range Status   Specimen Description BLOOD RIGHT ANTECUBITAL  Final   Special Requests   Final    BOTTLES DRAWN AEROBIC AND ANAEROBIC Blood Culture adequate volume   Culture   Final    NO GROWTH 2 DAYS Performed at [facility], [place] [address], [place], [place] [address]    Report Status PENDING  Incomplete  MRSA PCR Screening     Status: None   Collection Time: 05/07/17  1:42 AM  Result Value Ref Range Status   MRSA by PCR NEGATIVE NEGATIVE Final    Comment:        The GeneXpert MRSA Assay (FDA approved for NASAL specimens only), is one component of a comprehensive MRSA colonization surveillance program. It is not intended to diagnose MRSA infection nor to guide or monitor treatment for MRSA infections.  Labs: BNP (last 3 results) No results for input(s): BNP in the last 8760 hours. Basic Metabolic Panel:  Recent Labs Lab 05/06/17 1908 05/07/17 0253 05/09/17 0459  NA 136 136 151*  K 4.5 4.0 2.4*  CL 91* 96* 112*  CO2 33* 29 31  GLUCOSE 146* 122* 133*  BUN 81* 75* 49*  CREATININE 2.40* 2.03* 0.73  CALCIUM 7.6* 6.6* 5.6*   Liver Function Tests:  Recent Labs Lab 05/06/17 1908  AST 50*  ALT 29  ALKPHOS 91  BILITOT 0.3  PROT 6.5  ALBUMIN 2.6*    Recent Labs Lab 05/06/17 1908  LIPASE <10*   No results for input(s): AMMONIA in  the last 168 hours. CBC:  Recent Labs Lab 05/06/17 1908 05/07/17 0253 05/08/17 0642 05/09/17 0459  WBC 2.0* 1.3* 2.6* 3.6*  NEUTROABS 1.7  --   --   --   HGB 11.2* 9.9* 9.6* 8.5*  HCT 34.1* 29.5* 29.4* 26.7*  MCV 78.8 78.9 81.0 81.2  PLT 132* 113* 79* 48*   Cardiac Enzymes: No results for input(s): CKTOTAL, CKMB, CKMBINDEX, TROPONINI in the last 168 hours. BNP: Invalid input(s): POCBNP CBG:  Recent Labs Lab 05/08/17 1205 05/08/17 1834 05/08/17 2325 05/09/17 0632 05/09/17 1217  GLUCAP 139* 243* 161* 128* 97   D-Dimer No results for input(s): DDIMER in the last 72 hours. Hgb A1c No results for input(s): HGBA1C in the last 72 hours. Lipid Profile No results for input(s): CHOL, HDL, LDLCALC, TRIG, CHOLHDL, LDLDIRECT in the last 72 hours. Thyroid function studies No results for input(s): TSH, T4TOTAL, T3FREE, THYROIDAB in the last 72 hours.  Invalid input(s): FREET3 Anemia work up No results for input(s): VITAMINB12, FOLATE, FERRITIN, TIBC, IRON, RETICCTPCT in the last 72 hours. Urinalysis    Component Value Date/Time   COLORURINE AMBER (A) 05/07/2017 1300   APPEARANCEUR CLOUDY (A) 05/07/2017 1300   LABSPEC 1.039 (H) 05/07/2017 1300   PHURINE 5.0 05/07/2017 1300   GLUCOSEU NEGATIVE 05/07/2017 1300   HGBUR NEGATIVE 05/07/2017 1300   BILIRUBINUR NEGATIVE 05/07/2017 1300   KETONESUR NEGATIVE 05/07/2017 1300   PROTEINUR 30 (A) 05/07/2017 1300   NITRITE NEGATIVE 05/07/2017 1300   LEUKOCYTESUR NEGATIVE 05/07/2017 1300   Sepsis Labs Invalid input(s): PROCALCITONIN,  WBC,  LACTICIDVEN Microbiology Recent Results (from the past 240 hour(s))  Culture, blood (routine x 2)     Status: None (Preliminary result)   Collection Time: 05/06/17 12:30 AM  Result Value Ref Range Status   Specimen Description BLOOD LEFT ANTECUBITAL  Final   Special Requests   Final    BOTTLES DRAWN AEROBIC AND ANAEROBIC Blood Culture adequate volume   Culture   Final    NO GROWTH 2  DAYS Performed at [facility], [place] [address], [place], [place] [address]    Report Status PENDING  Incomplete  Culture, blood (routine x 2)     Status: None (Preliminary result)   Collection Time: 05/07/17 12:52 AM  Result Value Ref Range Status   Specimen Description BLOOD RIGHT ANTECUBITAL  Final   Special Requests   Final    BOTTLES DRAWN AEROBIC AND ANAEROBIC Blood Culture adequate volume   Culture   Final    NO GROWTH 2 DAYS Performed at [facility], [place] [address], [place], [place] [address]    Report Status PENDING  Incomplete  MRSA PCR Screening     Status: None   Collection Time: 05/07/17  1:42 AM  Result Value Ref Range Status  MRSA by PCR NEGATIVE NEGATIVE Final    Comment:        The GeneXpert MRSA Assay (FDA approved for NASAL specimens only), is one component of a comprehensive MRSA colonization surveillance program. It is not intended to diagnose MRSA infection nor to guide or monitor treatment for MRSA infections.      Time coordinating discharge: Over 30 minutes  SIGNED:   Louellen Molder, MD  [facility] 05/09/2017, 2:25 PM Pager   If 7PM-7AM, please contact night-coverage [email or web address] Password [email or web address]

## 2017-05-09 NOTE — Consult Note (Addendum)
[facility] [employer]  Received request from [place] for family interest in [facility]. Chart reviewed and met with spouse to complete paperwork for transfer today. Spoke with Dr. Clementeen Graham to confirm NG tube to remain for comfort. Discharge summary has been faxed. CSW Roselyn Reef is aware of above.   RN please call report to [phone].  Thank you,  Erling Conte, LCSW [phone]

## 2017-05-09 NOTE — Progress Notes (Signed)
PMT no charge note  Chart reviewed, agree with residential hospice arrangements.  Appreciate med onc, CSW and [facility] assistance No additional palliative recommendations.  Loistine Chance MD [phone] [facility] palliative medicine team

## 2017-05-09 NOTE — Progress Notes (Signed)
Pt is ready to dc to [facility]. [facility] transport required. Medical necessity form completed. Pt / family are in agreement with dc plan. DC Summary sent to facility for review. Scripts not needed for transfer. # for report provided to nsg.   Werner Lean LCSW [phone]

## 2017-05-09 NOTE — Progress Notes (Signed)
Beth,PharmD, called and reported pt's platelet level-48. Per pharmacist on 7/11 during the day it was recommended by Dr. Burr Medico to discoontinue the heparin drip I platelet count became less than 50. K. Kirby,NP paged and notified of previous recommendations.

## 2017-05-09 NOTE — Progress Notes (Signed)
KSamuella Bruin notified of pt's BP-88/44 ,P-88, and critical lab values.. Orders obtained.

## 2017-05-09 NOTE — Progress Notes (Signed)
CSW consulted to assist with Residential Hospice Home placement. CSW met with pt / family at bedside. Pt / family have chosen [facility] for hospice home choice. Erling Conte, [facility] liaison has been contacted and referral provided. Ms. Rosana Hoes will contact CSW once a placement decision has been made.  Werner Lean LCSW [phone]

## 2017-05-12 LAB — CULTURE, BLOOD (ROUTINE X 2)
Culture: NO GROWTH
Culture: NO GROWTH
SPECIAL REQUESTS: ADEQUATE
Special Requests: ADEQUATE

## 2017-05-19 ENCOUNTER — Ambulatory Visit: Payer: Medicare Other

## 2017-05-19 ENCOUNTER — Other Ambulatory Visit: Payer: Medicare Other

## 2017-05-19 ENCOUNTER — Ambulatory Visit: Payer: Medicare Other | Admitting: Hematology

## 2017-05-28 DEATH — deceased

## 2017-06-12 ENCOUNTER — Encounter: Payer: Self-pay | Admitting: Radiation Therapy

## 2017-06-12 NOTE — Progress Notes (Signed)
Pt Expired on 2017-05-18 Below is a clip from her obituary:

## 2017-06-13 ENCOUNTER — Encounter: Payer: Self-pay | Admitting: Internal Medicine

## 2017-08-08 ENCOUNTER — Other Ambulatory Visit: Payer: Self-pay | Admitting: Nurse Practitioner

## 2019-01-20 IMAGING — CT CT ABD-PELV W/ CM
2 of 5 series · 15 of 46 positions shown, 17 images · IV contrast (APPLIED)
Comparison: None.

CLINICAL DATA: Left inguinal mass.  Weight loss.

EXAM:
CT ABDOMEN AND PELVIS WITH CONTRAST
TECHNIQUE: Multidetector CT imaging of the abdomen and pelvis was performed
using the standard protocol following bolus administration of
intravenous contrast.
CONTRAST:  80mL SVP0XX-777 IOPAMIDOL (SVP0XX-777) INJECTION 61%

[Series 2: axial st · axial · 0.69mm/px · z∈[-412,-67]mm · 12 of 83 slices shown, 14 images]
[im 7/83  soft-tissue]
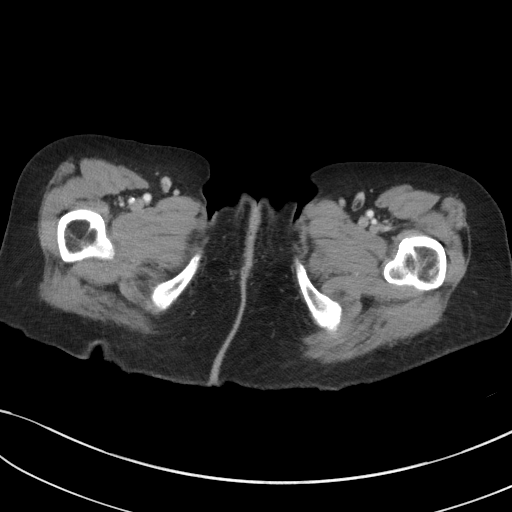
[im 7/83  bone]
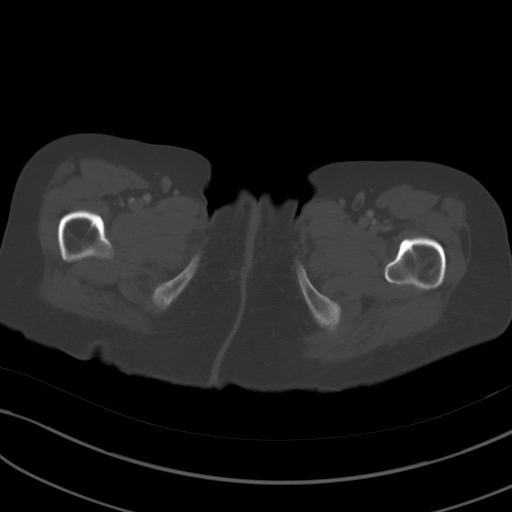
[im 13/83  soft-tissue]
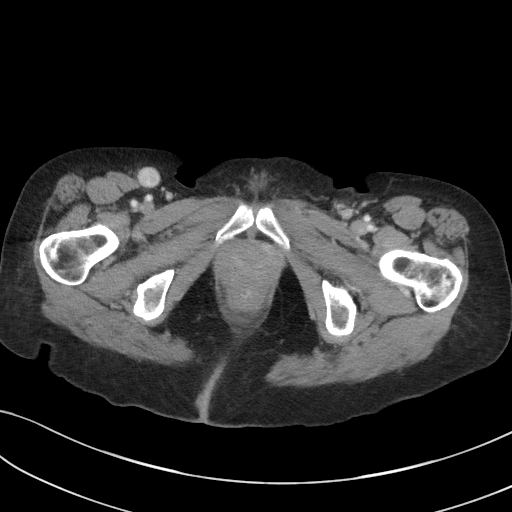
[im 19/83  soft-tissue]
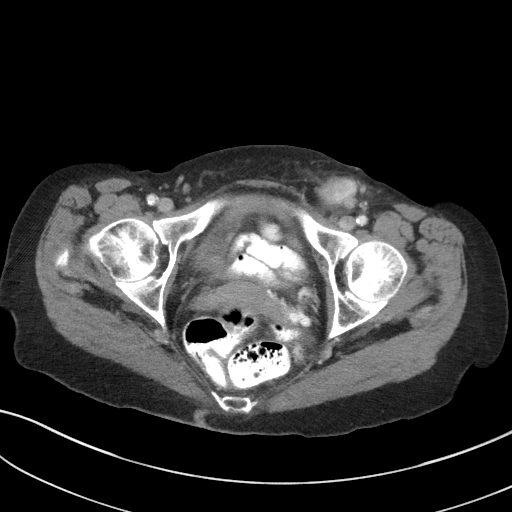
[im 26/83  soft-tissue]
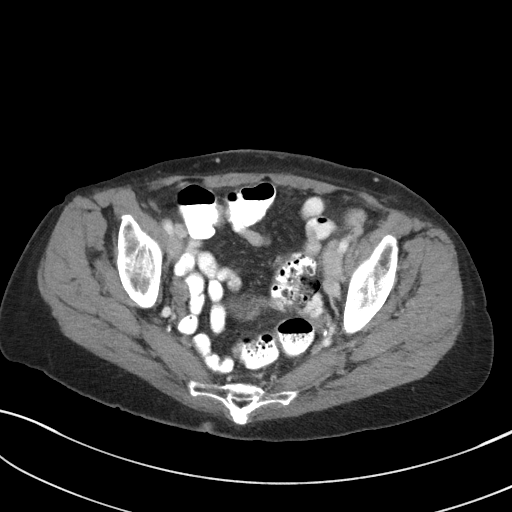
[im 32/83  soft-tissue]
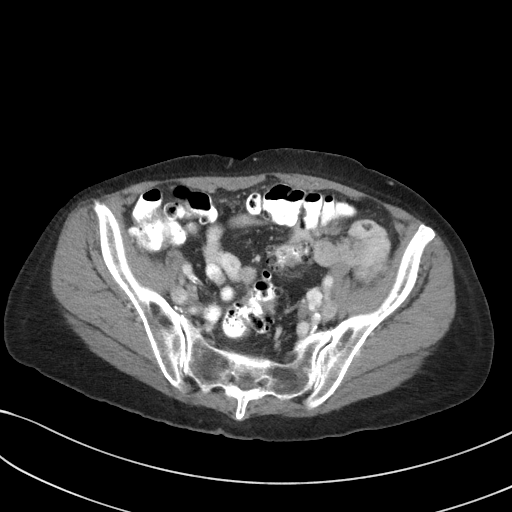
[im 38/83  soft-tissue]
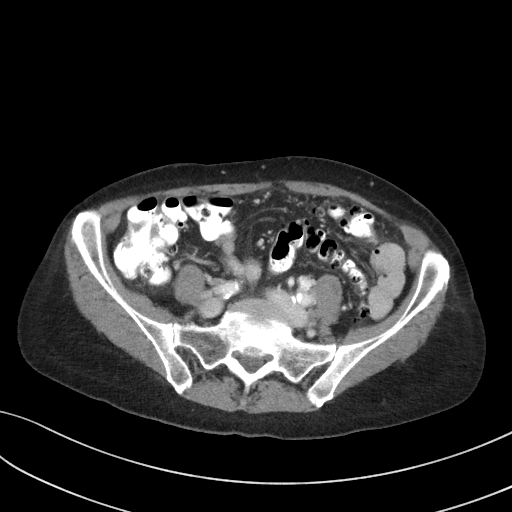
[im 45/83  soft-tissue]
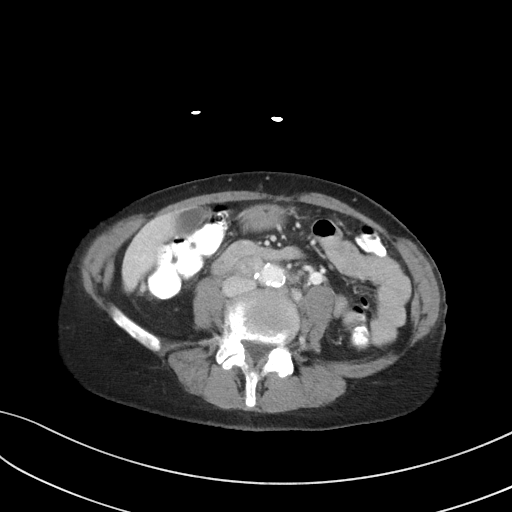
[im 51/83  soft-tissue]
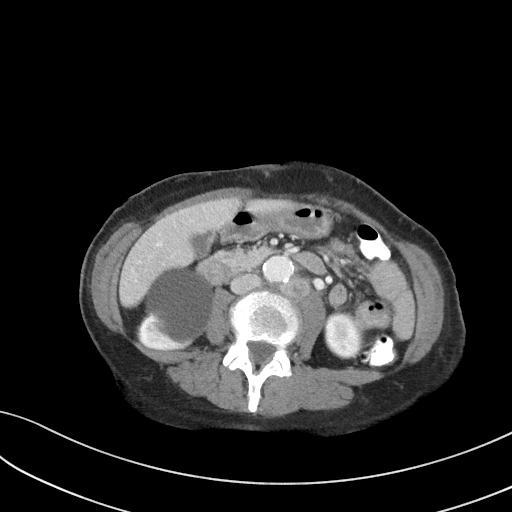
[im 57/83  soft-tissue]
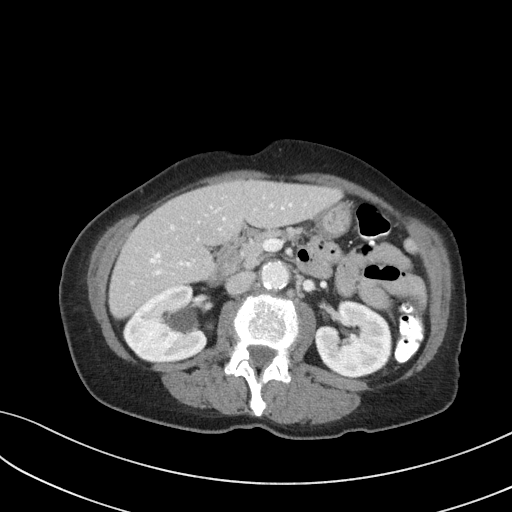
[im 57/83  bone]
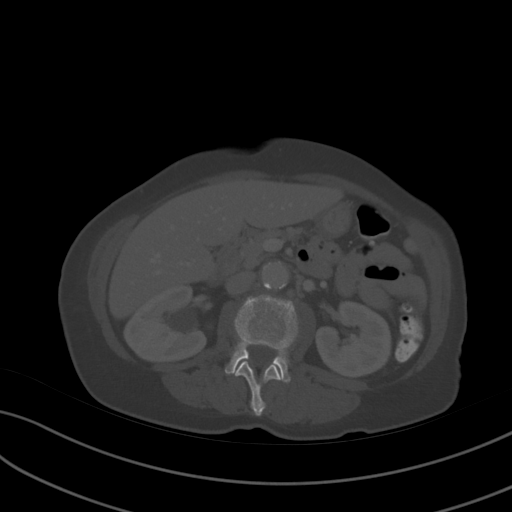
[im 64/83  soft-tissue]
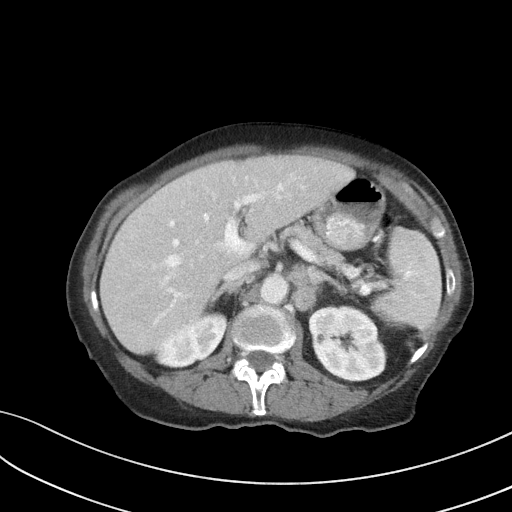
[im 70/83  soft-tissue]
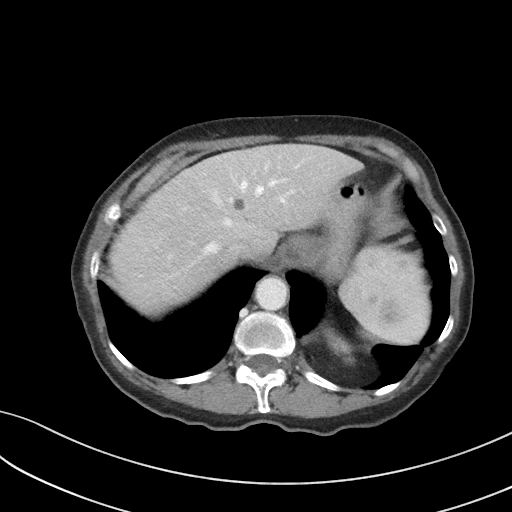
[im 76/83  soft-tissue]
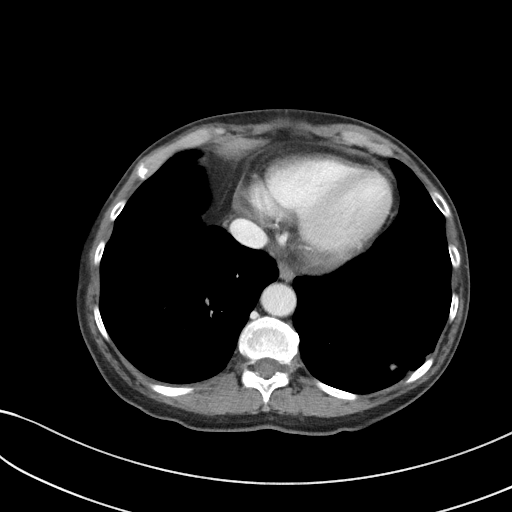

[Series 5: coronal st · coronal · 0.63mm/px · 3 of 85 slices shown]
[im 29/85  soft-tissue]
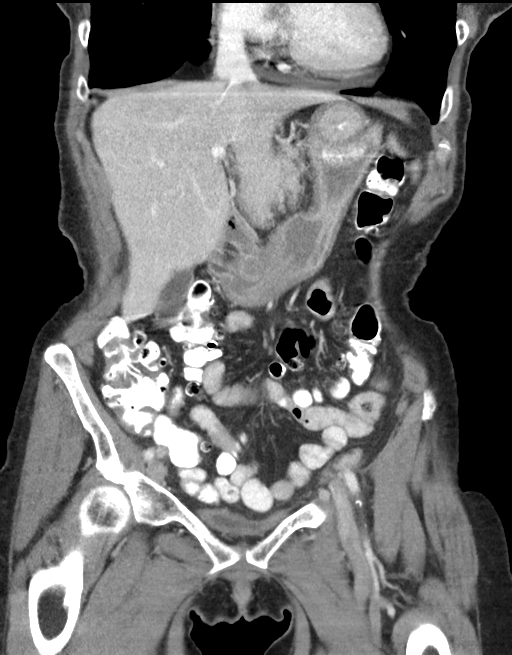
[im 38/85  soft-tissue]
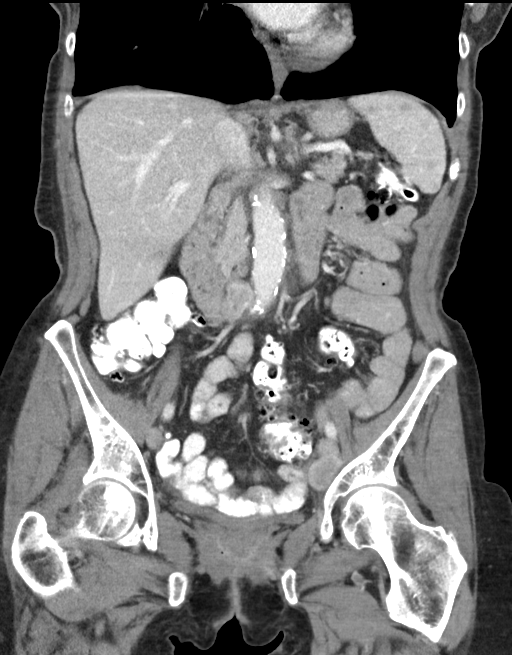
[im 47/85  soft-tissue]
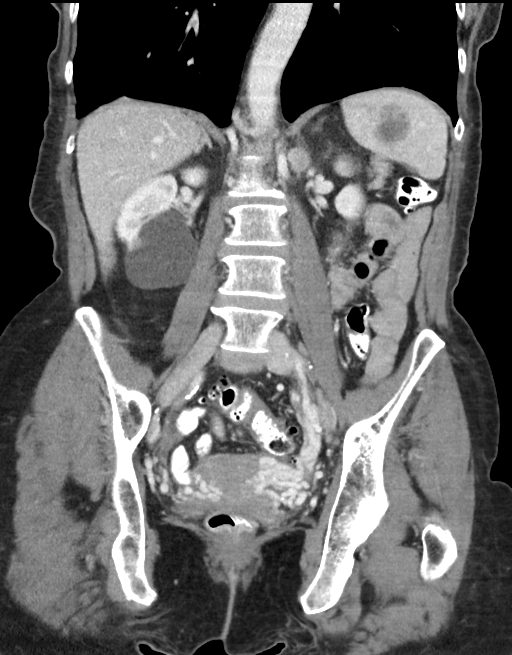

[15 of 46 positions shown; findings below may reference images not displayed]

FINDINGS: Lower chest: Small fat containing Bochdalek's hernia and mild
scarring in the left lung base. No pleural effusion.

Hepatobiliary: 8 mm hypodensity in the left hepatic lobe, too small
to fully characterize. Unremarkable gallbladder. No biliary
dilatation.

Pancreas: Unremarkable.

Spleen: Multiple hypoenhancing splenic masses measuring up to 3.1 cm
in size.

Adrenals/Urinary Tract: Unremarkable right adrenal gland. Slight
left adrenal nodularity. Two subcentimeter low-density lesions in
the left kidney, too small to fully characterize. 5.1 x 4.7 cm cyst
in the right lower pole. No hydronephrosis. Decompressed bladder.

Stomach/Bowel: The stomach is within normal limits. There is no
evidence of bowel obstruction. Colonic diverticulosis is noted
without evidence of diverticulitis. The appendix is unremarkable.

Vascular/Lymphatic: Diffuse abdominal aortic atherosclerosis without
aneurysm. There is diffuse retroperitoneal lymphadenopathy. Left
para-aortic lymph nodes measure up to 1.7 cm in short axis.
Aortocaval lymph nodes measure up to 1.5 cm. An enlarged left pelvic
sidewall lymph node measures 1.9 cm. Left external iliac lymph nodes
measure up to 1.6 cm. Left inguinal lymph nodes measure up to
cm. There is a 1.5 cm right femoral lymph node. 2 adjacent lymph
nodes/soft tissue nodules in the pelvic mesentery adjacent to the
sigmoid colon measure 1.2 x 1.1 cm and 1.6 x 1.2 cm (series 2,
images 52 and 55). The mesenteric and some of the retroperitoneal
lymph nodes demonstrate heterogeneous hypoenhancement and may be
partially necrotic. Some of the inguinal/ femoral lymph nodes appear
more hyperenhancing.

Reproductive: The uterus and ovaries are unremarkable. Prominent
vascular structures are noted in the adnexal bilaterally,
particularly on the left with a prominent left gonadal vein noted.

Other: No intraperitoneal free fluid.  No abdominal wall hernia.

Musculoskeletal: No suspicious osseous lesions identified.
IMPRESSION: 1. Extensive retroperitoneal, left inguinal, right femoral, and
mesenteric lymphadenopathy highly concerning for malignancy and
which may reflect metastatic disease or lymphoma. No clear primary
malignancy identified in the abdomen or pelvis.
2. Multiple splenic masses consistent with metastases.
3. Aortic atherosclerosis.
These results will be called to the ordering clinician or
representative by the Radiologist Assistant, and communication
documented in the PACS or zVision Dashboard.
